# Patient Record
Sex: Male | Born: 1975 | Race: White | Hispanic: No | Marital: Married | Smoking: Former smoker
Health system: Southern US, Community
[De-identification: ages and names within clinical notes are randomized; demographics above are authoritative.]

## PROBLEM LIST (undated history)

## (undated) DIAGNOSIS — T7840XA Allergy, unspecified, initial encounter: Secondary | ICD-10-CM

## (undated) DIAGNOSIS — E669 Obesity, unspecified: Secondary | ICD-10-CM

## (undated) DIAGNOSIS — I10 Essential (primary) hypertension: Secondary | ICD-10-CM

## (undated) DIAGNOSIS — J302 Other seasonal allergic rhinitis: Secondary | ICD-10-CM

## (undated) DIAGNOSIS — G43909 Migraine, unspecified, not intractable, without status migrainosus: Secondary | ICD-10-CM

## (undated) DIAGNOSIS — R748 Abnormal levels of other serum enzymes: Secondary | ICD-10-CM

## (undated) DIAGNOSIS — E66811 Obesity, class 1: Secondary | ICD-10-CM

## (undated) HISTORY — DX: Abnormal levels of other serum enzymes: R74.8

## (undated) HISTORY — DX: Obesity, unspecified: E66.9

## (undated) HISTORY — DX: Essential (primary) hypertension: I10

## (undated) HISTORY — DX: Allergy, unspecified, initial encounter: T78.40XA

## (undated) HISTORY — DX: Other seasonal allergic rhinitis: J30.2

## (undated) HISTORY — DX: Obesity, class 1: E66.811

## (undated) HISTORY — DX: Migraine, unspecified, not intractable, without status migrainosus: G43.909

---

## 2014-10-18 ENCOUNTER — Other Ambulatory Visit: Payer: Self-pay | Admitting: Family Medicine

## 2014-10-18 DIAGNOSIS — J309 Allergic rhinitis, unspecified: Secondary | ICD-10-CM

## 2014-10-18 DIAGNOSIS — R0982 Postnasal drip: Principal | ICD-10-CM

## 2014-10-18 NOTE — Telephone Encounter (Signed)
Pt needs refill on Loratidine, Mometasone furoate. [facility] [place].

## 2014-10-19 MED ORDER — MOMETASONE FUROATE 50 MCG/ACT NA SUSP: 2.0000 | Freq: Every day | NASAL | Status: DC

## 2014-10-19 MED ORDER — LORATADINE 10 MG PO TABS: 10.0000 mg | ORAL_TABLET | Freq: Every day | ORAL | Status: DC

## 2014-10-19 NOTE — Telephone Encounter (Signed)
Refill request was sent to Dr. Edwena Felty for approval and submission.  Loratadine , 1 (one) Tablet daily, #30, 30 days starting 07/17/2014, Ref. x2. Active. Associated Diagnosis:Allergic rhinitis, seasonal (477.9  J30.2)  Recorded 07/17/2014 10:45 AM by Edwena Felty, MD, Office Visit. ePrescribed to [phone], 07/17/2014 10:44 AM [id number], [address] [place], [place], [place] [address] [phone]  Nasonex 50MCG/ACT, 2 (two) Suspension in each nostril daily, 1 Bottle, 30 days starting 07/17/2014, Ref. x2. Active. Associated Diagnosis: Allergic rhinitis, seasonal (477.9  J30.2) Recorded 07/17/2014 10:45 AM by Edwena Felty, MD, Office Visit.

## 2014-10-24 ENCOUNTER — Ambulatory Visit (INDEPENDENT_AMBULATORY_CARE_PROVIDER_SITE_OTHER): Payer: BLUE CROSS/BLUE SHIELD | Admitting: Family Medicine

## 2014-10-24 ENCOUNTER — Encounter: Payer: Self-pay | Admitting: Family Medicine

## 2014-10-24 VITALS — BP 118/78 | HR 68 | Temp 98.3°F | Resp 16 | Ht 70.0 in | Wt 217.2 lb

## 2014-10-24 DIAGNOSIS — Z0000 Encounter for general adult medical examination without abnormal findings: Secondary | ICD-10-CM | POA: Diagnosis not present

## 2014-10-24 DIAGNOSIS — Z13220 Encounter for screening for lipoid disorders: Secondary | ICD-10-CM | POA: Diagnosis not present

## 2014-10-24 DIAGNOSIS — E66811 Obesity, class 1: Secondary | ICD-10-CM | POA: Insufficient documentation

## 2014-10-24 DIAGNOSIS — Z23 Encounter for immunization: Secondary | ICD-10-CM | POA: Diagnosis not present

## 2014-10-24 DIAGNOSIS — E669 Obesity, unspecified: Secondary | ICD-10-CM | POA: Insufficient documentation

## 2014-10-24 DIAGNOSIS — R748 Abnormal levels of other serum enzymes: Secondary | ICD-10-CM | POA: Diagnosis not present

## 2014-10-24 DIAGNOSIS — Z720 Tobacco use: Secondary | ICD-10-CM

## 2014-10-24 DIAGNOSIS — G43009 Migraine without aura, not intractable, without status migrainosus: Secondary | ICD-10-CM | POA: Diagnosis not present

## 2014-10-24 DIAGNOSIS — F17210 Nicotine dependence, cigarettes, uncomplicated: Secondary | ICD-10-CM | POA: Insufficient documentation

## 2014-10-24 DIAGNOSIS — I10 Essential (primary) hypertension: Secondary | ICD-10-CM | POA: Insufficient documentation

## 2014-10-24 MED ORDER — SUMATRIPTAN SUCCINATE 50 MG PO TABS: ORAL_TABLET | ORAL | Status: DC

## 2014-10-24 MED ORDER — INFLUENZA VAC SPLIT QUAD 0.5 ML IM SUSP: 0.5000 mL | Freq: Once | INTRAMUSCULAR | Status: DC

## 2014-10-24 NOTE — Progress Notes (Signed)
Name: Robert Vang   MRN: [id number]    DOB: [birth year]   Date:10/24/2014       Progress Note  Subjective  Chief Complaint  Chief Complaint  Patient presents with  . Annual Exam  . Medication Refill    imitrex    HPI  Patient is here today for a Complete Male Physical Exam:  The patient has no acute concerns. Overall feels healthy. Diet is not well balanced he admits to eating high carb, high cholesterol foods. 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 Sees dentist regularly and addresses vision concerns with ophthalmologist if applicable. In regards to sexual activity the patient is currently sexually active. Currently is not concerned about exposure to any STDs.   Patient here for follow-up of elevated blood pressure. He is exercising and is adherent to low salt diet.  Using benazepril 6m one a day. Blood pressure is well controlled at home. Cardiac symptoms none. Patient denies chest pain, chest pressure/discomfort, dyspnea, fatigue, irregular heart beat, lower extremity edema, near-syncope, orthopnea and palpitations.  Cardiovascular risk factors: hypertension and male gender. Use of agents associated with hypertension: none. History of target organ damage: none.     [name] suffers from chronic migraine headaches. Generally, the headaches last about 1-2 days and occur once a month. The headaches usually occur upon waking up but one time a few months ago an episode started after lunch time at work and lasted several days.  Since then this has not happened again.  The headaches are usually sharp and squeezing and are frontal in location. The patient rates his most severe headaches a 8 on a scale from 1 to 10. Recently, the headaches are decreasing in frequency. Work attendance or other daily activities are not affected by the headaches. Precipitating factors include none which have been determined. The headaches are  usually not preceded by an aura consisting of blurry vision. Associated neurologic symptoms which are present include: none. The patient denies nausea, vomiting, visual changes, dizziness, loss of balance, muscle weakness, numbness of extremities and speech difficulties during or after the headaches. Other associated symptoms include: nothing pertinent. Home treatment has included Imitrex 50 mg which works well.   Past Medical History  Diagnosis Date  . Obesity, Class I, BMI 30-34.9   . HTN, goal below 140/90   . Allergic rhinitis, seasonal   . Allergy   . Elevated liver enzymes   . Migraine headache     History reviewed. No pertinent past surgical history.  Family History  Problem Relation Age of Onset  . Cancer Maternal Uncle     colon  . Diabetes Maternal Grandfather     Social History   Social History  . Marital Status: Married    Spouse Name: N/A  . Number of Children: N/A  . Years of Education: N/A   Occupational History  . Not on file.   Social History Main Topics  . Smoking status: Current Some Day Smoker  . Smokeless tobacco: Current User    Types: Chew  . Alcohol Use: 0.0 oz/week    0 Standard drinks or equivalent per week     Comment: ocassionally  . Drug Use: No  . Sexual Activity:    Partners: Female    BMuseum/gallery curator None   Other Topics Concern  . Not on file   Social History Narrative  . No narrative on file  Current outpatient prescriptions:  .  benazepril (LOTENSIN) 5 MG tablet, , Disp: , Rfl:  .  loratadine (CLARITIN) 10 MG tablet, Take 1 tablet (10 mg total) by mouth daily., Disp: 30 tablet, Rfl: 5 .  mometasone (NASONEX) 50 MCG/ACT nasal spray, Place 2 sprays into the nose daily., Disp: 17 g, Rfl: 5 .  SUMAtriptan (IMITREX) 25 MG tablet, , Disp: , Rfl:   No Known Allergies  ROS  CONSTITUTIONAL: No significant weight changes, fever, chills, weakness or fatigue.  HEENT:  - Eyes: No visual changes.  - Ears: No auditory  changes. No pain.  - Nose: No sneezing, congestion, runny nose. - Throat: No sore throat. No changes in swallowing. SKIN: No rash or itching.  CARDIOVASCULAR: No chest pain, chest pressure or chest discomfort. No palpitations or edema.  RESPIRATORY: No shortness of breath, cough or sputum.  GASTROINTESTINAL: No anorexia, nausea, vomiting. No changes in bowel habits. No abdominal pain or blood.  GENITOURINARY: No dysuria. No frequency. No discharge.  NEUROLOGICAL: No headache, dizziness, syncope, paralysis, ataxia, numbness or tingling in the extremities. No memory changes. No change in bowel or bladder control.  MUSCULOSKELETAL: No joint pain. No muscle pain. HEMATOLOGIC: No anemia, bleeding or bruising.  LYMPHATICS: No enlarged lymph nodes.  PSYCHIATRIC: No change in mood. No change in sleep pattern.  ENDOCRINOLOGIC: No reports of sweating, cold or heat intolerance. No polyuria or polydipsia.   Objective  Filed Vitals:   10/24/14 0901  BP: 118/78  Pulse: 68  Temp: 98.3 F (36.8 C)  TempSrc: Oral  Resp: 16  Height: _0  (1.778 m)  Weight: 217 lb 3.2 oz (98.521 kg)  SpO2: 97%   Body mass index is 31.16 kg/(m^2).  Depression screen PHQ 2/9 10/24/2014  Decreased Interest 0  Down, Depressed, Hopeless 0  PHQ - 2 Score 0     Physical Exam  Constitutional: Patient appears well-developed and well-nourished. In no distress.  HEENT:  - Head: Normocephalic and atraumatic.  - Ears: Bilateral TMs gray, no erythema or effusion - Nose: Nasal mucosa moist - Mouth/Throat: Oropharynx is clear and moist. No tonsillar hypertrophy or erythema. No post nasal drainage.  - Eyes: Conjunctivae clear, EOM movements normal. PERRLA. No scleral icterus.  Neck: Normal range of motion. Neck supple. No JVD present. No thyromegaly present.  Cardiovascular: Normal rate, regular rhythm and normal heart sounds.  No murmur heard.  Pulmonary/Chest: Effort normal and breath sounds normal. No respiratory  distress. Abdominal: Soft. Bowel sounds are normal, no distension. There is no tenderness. no masses BREAST: Bilateral breast exam normal with no masses, skin changes or nipple discharge MALE GENITALIA: Bilateral testes descended with no masses, no penile lesions, no penile discharge. PROSTATE: Normal prostate size and consistency. RECTAL: no rectal masses or hemorrhoids Musculoskeletal: Normal range of motion bilateral UE and LE, no joint effusions. Peripheral vascular: Bilateral LE no edema. Neurological: CN II-XII grossly intact with no focal deficits. Alert and oriented to person, place, and time. Coordination, balance, strength, speech and gait are normal.  Skin: Skin is warm and dry. No rash noted. No erythema.  Psychiatric: Patient has a normal mood and affect. Behavior is normal in office today. Judgment and thought content normal in office today.    Assessment & Plan  1. Annual physical exam Healthy male with stable chronic medical issues.   2. Hypertension goal BP (blood pressure) < 140/90 JNC-8 vs ADA treatment goals discussed with patient. Current blood pressure well controled. Continue current regimen of anti-hypertensive medications.  On review of most current eGFR on blood work there is not concern for Chronic Kidney Disease at this time.    - CBC with Differential/Platelet - Comprehensive metabolic panel - Lipid panel  3. Migraine without aura and without status migrainosus, not intractable Clinically stable findings based on clinical exam and on review of any pertinent results. Recommended to patient that they continue their current regimen with regular follow ups.  - SUMAtriptan (IMITREX) 50 MG tablet; Take 1-2 tablet by mouth at onset of headache, may repeat x1 2hrs later, max 265m/day  Dispense: 20 tablet; Refill: 5  4. Obesity, Class I, BMI 30-34.9 The patient has been counseled on their higher than normal BMI.  They have verbally expressed understanding their  increased risk for other diseases.  In efforts to meet a better target BMI goal the patient has been counseled on lifestyle, diet and exercise modification tactics. Start with moderate intensity aerobic exercise (walking, jogging, elliptical, swimming, group or individual sports, hiking) at least 330ms a day at least 4 days a week and increase intensity, duration, frequency as tolerated. Diet should include well balance fresh fruits and vegetables avoiding processed foods, carbohydrates and sugars. Drink at least 8oz 10 glasses a day avoiding sodas, sugary fruit drinks, sweetened tea. Check weight on a reliable scale daily and monitor weight loss progress daily. Consider investing in mobile phone apps that will help keep track of weight loss goals.   5. Cigarette smoker The patient has been counseled on smoking cessation benefits, goals, strategies and available over the counter and prescription medications that may help them in their efforts.  Options discussed include Nicoderm patches, Wellbutrin and Chantix.  The patient voices understanding their increased risk of cardiovascular and pulmonary diseases with continued use of tobacco products.   6. Elevated liver enzymes Due to repeat lab work. Likely fatty liver.  - Comprehensive metabolic panel  7. Screening cholesterol level Screening for cholesterol  - Lipid panel  8. Need for immunization against influenza Flu shot given today.  - influenza vac split quadrivalent PF (FLUARIX) injection 0.5 mL; Inject 0.5 mLs into the muscle once.

## 2014-10-25 LAB — CBC WITH DIFFERENTIAL/PLATELET
Basophils Absolute: 0.1 10*3/uL (ref 0.0–0.2)
Basos: 1 %
EOS (ABSOLUTE): 0.2 10*3/uL (ref 0.0–0.4)
Eos: 2 %
HEMOGLOBIN: 15.3 g/dL (ref 12.6–17.7)
Hematocrit: 44.4 % (ref 37.5–51.0)
Immature Grans (Abs): 0 10*3/uL (ref 0.0–0.1)
Immature Granulocytes: 0 %
LYMPHS ABS: 2.1 10*3/uL (ref 0.7–3.1)
Lymphs: 33 %
MCH: 30.2 pg (ref 26.6–33.0)
MCHC: 34.5 g/dL (ref 31.5–35.7)
MCV: 88 fL (ref 79–97)
MONOCYTES: 9 %
MONOS ABS: 0.6 10*3/uL (ref 0.1–0.9)
NEUTROS ABS: 3.6 10*3/uL (ref 1.4–7.0)
Neutrophils: 55 %
Platelets: 262 10*3/uL (ref 150–379)
RBC: 5.06 x10E6/uL (ref 4.14–5.80)
RDW: 13.5 % (ref 12.3–15.4)
WBC: 6.6 10*3/uL (ref 3.4–10.8)

## 2014-10-25 LAB — LIPID PANEL
CHOLESTEROL TOTAL: 135 mg/dL (ref 100–199)
Chol/HDL Ratio: 3 ratio units (ref 0.0–5.0)
HDL: 45 mg/dL (ref >39)
LDL Calculated: 69 mg/dL (ref 0–99)
TRIGLYCERIDES: 106 mg/dL (ref 0–149)
VLDL Cholesterol Cal: 21 mg/dL (ref 5–40)

## 2014-10-25 LAB — COMPREHENSIVE METABOLIC PANEL
ALBUMIN: 4.4 g/dL (ref 3.5–5.5)
ALK PHOS: 75 IU/L (ref 39–117)
ALT: 35 IU/L (ref 0–44)
AST: 24 IU/L (ref 0–40)
Albumin/Globulin Ratio: 1.9 (ref 1.1–2.5)
BILIRUBIN TOTAL: 0.5 mg/dL (ref 0.0–1.2)
BUN / CREAT RATIO: 14 (ref 8–19)
BUN: 12 mg/dL (ref 6–20)
CHLORIDE: 103 mmol/L (ref 97–108)
CO2: 25 mmol/L (ref 18–29)
Calcium: 9.6 mg/dL (ref 8.7–10.2)
Creatinine, Ser: 0.86 mg/dL (ref 0.76–1.27)
GFR calc Af Amer: 127 mL/min/{1.73_m2} (ref >59)
GFR calc non Af Amer: 110 mL/min/{1.73_m2} (ref >59)
GLOBULIN, TOTAL: 2.3 g/dL (ref 1.5–4.5)
Glucose: 104 mg/dL — ABNORMAL HIGH (ref 65–99)
Potassium: 4.6 mmol/L (ref 3.5–5.2)
SODIUM: 143 mmol/L (ref 134–144)
Total Protein: 6.7 g/dL (ref 6.0–8.5)

## 2014-11-02 ENCOUNTER — Telehealth: Payer: Self-pay | Admitting: Family Medicine

## 2014-11-02 DIAGNOSIS — G43009 Migraine without aura, not intractable, without status migrainosus: Secondary | ICD-10-CM

## 2014-11-02 MED ORDER — SUMATRIPTAN SUCCINATE 50 MG PO TABS: ORAL_TABLET | ORAL | Status: DC

## 2014-11-02 NOTE — Telephone Encounter (Signed)
Tried to contact this patient to relay the information below, but he was not in. I spoke with his wife and she stated that she will contact the insurance company to find out what's going on and I said ok.

## 2014-11-02 NOTE — Telephone Encounter (Signed)
Please let Mr. Filomeno Cromley know: I got a letter from his insurance stating they will only cover #18 of his Sumatriptan 50 mg medication for his headaches. New Rx sent to his pharmacy. If he has trouble picking it up let us know.

## 2014-11-03 ENCOUNTER — Telehealth: Payer: Self-pay | Admitting: Family Medicine

## 2014-11-03 NOTE — Telephone Encounter (Signed)
Dr. Sherley Bounds returned this patient's call and spoke with the wife.   Wife said that she called the insurance company and they stated they will dispense 9 PDA, but 9 is good for this patient at the moment.

## 2014-11-03 NOTE — Telephone Encounter (Signed)
Pt states that our office called her yesterday about a RX and is wanting a call back.

## 2015-03-07 ENCOUNTER — Ambulatory Visit (INDEPENDENT_AMBULATORY_CARE_PROVIDER_SITE_OTHER): Payer: BLUE CROSS/BLUE SHIELD | Admitting: Family Medicine

## 2015-03-07 ENCOUNTER — Encounter: Payer: Self-pay | Admitting: Family Medicine

## 2015-03-07 ENCOUNTER — Ambulatory Visit
Admission: RE | Admit: 2015-03-07 | Discharge: 2015-03-07 | Disposition: A | Discharge status: Home / Self Care | Payer: BLUE CROSS/BLUE SHIELD | Source: Ambulatory Visit | Attending: Family Medicine | Admitting: Family Medicine

## 2015-03-07 VITALS — BP 140/82 | HR 66 | Temp 97.4°F | Resp 12 | Ht 70.0 in | Wt 227.0 lb

## 2015-03-07 DIAGNOSIS — R079 Chest pain, unspecified: Secondary | ICD-10-CM | POA: Insufficient documentation

## 2015-03-07 NOTE — Progress Notes (Signed)
Name: Robert Vang   MRN: [id number]    DOB: May 10, 1975   Date:03/07/2015       Progress Note  Subjective  Chief Complaint  Chief Complaint  Patient presents with  . Chest Pain    patient stated that he has been having some right side chest discomfort. the actual location is under his nipple that radiates to the side. patient stated that he has had some sharp shooting pains under his pectoris.    HPI  Robert Vang is a 39 year old male with right sided chest wall pain. Onset 1 month ago. Located just under right nipple, pectoralis muscle. Sometimes radiates across anterior chest wall to sternum. Can occur suddenly, lasting less than a second, sharp pain, resolves on it's own, not occuring at night. Not associated with left sided chest pain, substernal pain, SOB, dizziness, fatigue, fevers, chills, cough. Has not tried anything for these symptoms as he was not sure what was causing it. 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 Did have severe GERD 1 month ago at onset, resolved with TUMS use at the time. Not having epigastric pain, brash or GERD symptoms recently.  Past Medical History  Diagnosis Date  . Obesity, Class I, BMI 30-34.9   . HTN, goal below 140/90   . Allergic rhinitis, seasonal   . Allergy   . Elevated liver enzymes   . Migraine headache     Patient Active Problem List   Diagnosis Date Noted  . Hypertension goal BP (blood pressure) < 140/90 10/24/2014  . Migraine without aura and without status migrainosus, not intractable 10/24/2014  . Obesity, Class I, BMI 30-34.9 10/24/2014  . Cigarette smoker 10/24/2014  . Elevated liver enzymes 10/24/2014  . Screening cholesterol level 10/24/2014  . Need for immunization against influenza 10/24/2014  . Annual physical exam 10/24/2014    Social History  Substance Use Topics  . Smoking status: Current Some Day Smoker  . Smokeless tobacco: Current User    Types: Chew  . Alcohol Use: 0.0  oz/week    0 Standard drinks or equivalent per week     Comment: ocassionally     Current outpatient prescriptions:  .  benazepril (LOTENSIN) 5 MG tablet, , Disp: , Rfl:  .  loratadine (CLARITIN) 10 MG tablet, Take 1 tablet (10 mg total) by mouth daily., Disp: 30 tablet, Rfl: 5 .  mometasone (NASONEX) 50 MCG/ACT nasal spray, Place 2 sprays into the nose daily., Disp: 17 g, Rfl: 5 .  SUMAtriptan (IMITREX) 50 MG tablet, Take 1-2 tablet by mouth at onset of headache, may repeat x1 2hrs later, max /day, Disp: 18 tablet, Rfl: 5  History reviewed. No pertinent past surgical history.  Family History  Problem Relation Age of Onset  . Cancer Maternal Uncle     colon  . Diabetes Maternal Grandfather     No Known Allergies   Review of Systems  CONSTITUTIONAL: No significant weight changes, fever, chills, weakness or fatigue.  CARDIOVASCULAR: Yes chest pain. No chest pressure or chest discomfort. No palpitations or edema.  RESPIRATORY: No shortness of breath, cough or sputum.  GASTROINTESTINAL: No anorexia, nausea, vomiting. No changes in bowel habits. No abdominal pain or blood.  NEUROLOGICAL: No headache, dizziness, syncope, paralysis, ataxia, numbness or tingling in the extremities. No memory changes. No change in bowel or bladder control.  PSYCHIATRIC: No change in mood. No change in sleep pattern.    Objective  BP 140/82 mmHg  Pulse 66  Temp(Src) 97.4 F (36.3 C) (Oral)  Resp 12  Ht 5\' 10"  (1.778 m)  Wt 227 lb (102.967 kg)  BMI 32.57 kg/m2  SpO2 97% Body mass index is 32.57 kg/(m^2).  Physical Exam  Constitutional: Patient appears well-developed and well-nourished. In no distress.  Neck: Normal range of motion. Neck supple. No JVD present. No thyromegaly present.  Cardiovascular: Normal rate, regular rhythm and normal heart sounds.  No murmur heard.  Pulmonary/Chest: No reproducible chest wall pain. Effort normal and breath sounds normal. No respiratory  distress. Musculoskeletal: Normal range of motion bilateral UE and LE, no joint effusions. Psychiatric: Patient has a normal mood and affect. Behavior is normal in office today. Judgment and thought content normal in office today.   Assessment & Plan  1. Right-sided chest pain EKG NSR with no ST segment changes. Less likely to be cardiac in nature however BP is borderline today. FLP 10/2014 well controled. Risk factors minimal. Will get Troponin to be complete and CXR. Start PT, likely musculoskeletal.   - EKG 12-Lead - Troponin I - DG Chest 2 View; Future - Ambulatory referral to Physical Therapy

## 2015-03-08 LAB — TROPONIN I

## 2015-03-13 ENCOUNTER — Telehealth: Payer: Self-pay

## 2015-03-13 ENCOUNTER — Telehealth: Payer: Self-pay | Admitting: Family Medicine

## 2015-03-13 NOTE — Telephone Encounter (Signed)
Pts wife called and has questions about a RX please return her call.

## 2015-03-13 NOTE — Telephone Encounter (Signed)
Wants to get rx for nicotrol inhaler to help quit dipping? Didn't know if you could do since saw him last week but did not discuss?

## 2015-03-14 ENCOUNTER — Other Ambulatory Visit: Payer: Self-pay | Admitting: Family Medicine

## 2015-03-14 DIAGNOSIS — F17220 Nicotine dependence, chewing tobacco, uncomplicated: Secondary | ICD-10-CM

## 2015-03-14 MED ORDER — NICOTINE 10 MG IN INHA: RESPIRATORY_TRACT | Status: DC

## 2015-03-14 NOTE — Telephone Encounter (Signed)
[facility] If insurance does not cover medications ask patient to find out if they will cover nicotine patches.

## 2015-03-14 NOTE — Telephone Encounter (Signed)
Wife Constance Haw(Lorie Lengyel) would like return call from the provider to discuss the medication. [phone]

## 2015-05-02 ENCOUNTER — Other Ambulatory Visit: Payer: Self-pay | Admitting: Family Medicine

## 2015-07-03 ENCOUNTER — Other Ambulatory Visit: Payer: Self-pay

## 2015-07-03 DIAGNOSIS — J309 Allergic rhinitis, unspecified: Secondary | ICD-10-CM

## 2015-07-03 DIAGNOSIS — R0982 Postnasal drip: Principal | ICD-10-CM

## 2015-07-03 MED ORDER — MOMETASONE FUROATE 50 MCG/ACT NA SUSP: 2.0000 | Freq: Every day | NASAL | Status: DC

## 2015-07-12 ENCOUNTER — Encounter: Payer: Self-pay | Admitting: Family Medicine

## 2015-07-12 ENCOUNTER — Ambulatory Visit (INDEPENDENT_AMBULATORY_CARE_PROVIDER_SITE_OTHER): Payer: BLUE CROSS/BLUE SHIELD | Admitting: Family Medicine

## 2015-07-12 VITALS — BP 117/72 | HR 80 | Temp 98.5°F | Resp 14 | Ht 70.0 in | Wt 218.0 lb

## 2015-07-12 DIAGNOSIS — E669 Obesity, unspecified: Secondary | ICD-10-CM | POA: Diagnosis not present

## 2015-07-12 DIAGNOSIS — F17210 Nicotine dependence, cigarettes, uncomplicated: Secondary | ICD-10-CM

## 2015-07-12 DIAGNOSIS — Z720 Tobacco use: Secondary | ICD-10-CM

## 2015-07-12 DIAGNOSIS — J069 Acute upper respiratory infection, unspecified: Secondary | ICD-10-CM

## 2015-07-12 DIAGNOSIS — I10 Essential (primary) hypertension: Secondary | ICD-10-CM | POA: Diagnosis not present

## 2015-07-12 MED ORDER — BENAZEPRIL HCL 5 MG PO TABS: 5.0000 mg | ORAL_TABLET | Freq: Every day | ORAL | Status: DC

## 2015-07-12 NOTE — Patient Instructions (Addendum)
Try to use PLAIN allergy medicine without the decongestant Avoid: phenylephrine, phenylpropanolamine, and pseudoephredine  Try to lose down to 200 pounds to start with, and a goal of 190 pounds ultimately  Your goal blood pressure is less than 140 mmHg on top. Try to follow the DASH guidelines (DASH stands for Dietary Approaches to Stop Hypertension) Try to limit the sodium in your diet.  Ideally, consume less than 1.5 grams (less than 1,500mg ) per day. Do not add salt when cooking or at the table.  Check the sodium amount on labels when shopping, and choose items lower in sodium when given a choice. Avoid or limit foods that already contain a lot of sodium. Eat a diet rich in fruits and vegetables and whole grains.  If your blood pressure drops below 110 on top, then you can stop your blood pressure pill  DASH Eating Plan DASH stands for "Dietary Approaches to Stop Hypertension." The DASH eating plan is a healthy eating plan that has been shown to reduce high blood pressure (hypertension). Additional health benefits may include reducing the risk of type 2 diabetes mellitus, heart disease, and stroke. The DASH eating plan may also help with weight loss. WHAT DO I NEED TO KNOW ABOUT THE DASH EATING PLAN? For the DASH eating plan, you will follow these general guidelines:  Choose foods with a percent daily value for sodium of less than 5% (as listed on the food label).  Use salt-free seasonings or herbs instead of table salt or sea salt.  Check with your health care provider or pharmacist before using salt substitutes.  Eat lower-sodium products, often labeled as "lower sodium" or "no salt added."  Eat fresh foods.  Eat more vegetables, fruits, and low-fat dairy products.  Choose whole grains. Look for the word "whole" as the first word in the ingredient list.  Choose fish and skinless chicken or [place] more often than red meat. Limit fish, poultry, and meat to 6 oz (170 g) each  day.  Limit sweets, desserts, sugars, and sugary drinks.  Choose heart-healthy fats.  Limit cheese to 1 oz (28 g) per day.  Eat more home-cooked food and less restaurant, buffet, and fast food.  Limit fried foods.  Cook foods using methods other than frying.  Limit canned vegetables. If you do use them, rinse them well to decrease the sodium.  When eating at a restaurant, ask that your food be prepared with less salt, or no salt if possible. WHAT FOODS CAN I EAT? Seek help from a dietitian for individual calorie needs. Grains Whole grain or whole wheat bread. Brown rice. Whole grain or whole wheat pasta. Quinoa, bulgur, and whole grain cereals. Low-sodium cereals. Corn or whole wheat flour tortillas. Whole grain cornbread. Whole grain crackers. Low-sodium crackers. Vegetables Fresh or frozen vegetables (raw, steamed, roasted, or grilled). Low-sodium or reduced-sodium tomato and vegetable juices. Low-sodium or reduced-sodium tomato sauce and paste. Low-sodium or reduced-sodium canned vegetables.  Fruits All fresh, canned (in natural juice), or frozen fruits. Meat and Other Protein Products Ground beef (85% or leaner), grass-fed beef, or beef trimmed of fat. Skinless chicken or [place]. Ground chicken or [place]. Pork trimmed of fat. All fish and seafood. Eggs. Dried beans, peas, or lentils. Unsalted nuts and seeds. Unsalted canned beans. Dairy Low-fat dairy products, such as skim or 1% milk, 2% or reduced-fat cheeses, low-fat ricotta or cottage cheese, or plain low-fat yogurt. Low-sodium or reduced-sodium cheeses. Fats and Oils Tub margarines without trans fats. Light or reduced-fat mayonnaise  and salad dressings (reduced sodium). Avocado. Safflower, olive, or canola oils. Natural peanut or almond butter. Other Unsalted popcorn and pretzels. The items listed above may not be a complete list of recommended foods or beverages. Contact your dietitian for more options. WHAT FOODS ARE NOT  RECOMMENDED? Grains White bread. White pasta. White rice. Refined cornbread. Bagels and croissants. Crackers that contain trans fat. Vegetables Creamed or fried vegetables. Vegetables in a cheese sauce. Regular canned vegetables. Regular canned tomato sauce and paste. Regular tomato and vegetable juices. Fruits Dried fruits. Canned fruit in light or heavy syrup. Fruit juice. Meat and Other Protein Products Fatty cuts of meat. Ribs, chicken wings, bacon, sausage, bologna, salami, chitterlings, fatback, hot dogs, bratwurst, and packaged luncheon meats. Salted nuts and seeds. Canned beans with salt. Dairy Whole or 2% milk, cream, half-and-half, and cream cheese. Whole-fat or sweetened yogurt. Full-fat cheeses or blue cheese. Nondairy creamers and whipped toppings. Processed cheese, cheese spreads, or cheese curds. Condiments Onion and garlic salt, seasoned salt, table salt, and sea salt. Canned and packaged gravies. Worcestershire sauce. Tartar sauce. Barbecue sauce. Teriyaki sauce. Soy sauce, including reduced sodium. Steak sauce. Fish sauce. Oyster sauce. Cocktail sauce. Horseradish. Ketchup and mustard. Meat flavorings and tenderizers. Bouillon cubes. Hot sauce. Tabasco sauce. Marinades. Taco seasonings. Relishes. Fats and Oils Butter, stick margarine, lard, shortening, ghee, and bacon fat. Coconut, palm kernel, or palm oils. Regular salad dressings. Other Pickles and olives. Salted popcorn and pretzels. The items listed above may not be a complete list of foods and beverages to avoid. Contact your dietitian for more information. WHERE CAN I FIND MORE INFORMATION? National Heart, Lung, and Blood Institute: CableProm[employer].itwww.nhlbi.nih.gov/health/health-topics/topics/dash/   This information is not intended to replace advice given to you by your health care provider. Make sure you discuss any questions you have with your health care provider.   Document Released: 02/13/2011 Document Revised: 03/17/2014  Document Reviewed: 12/29/2012 Elsevier Interactive Patient Education [employer]2016 Elsevier Inc.

## 2015-07-12 NOTE — Assessment & Plan Note (Signed)
Encouraged weight loss 

## 2015-07-12 NOTE — Assessment & Plan Note (Signed)
Discussed DASH guidelines; continue ACE-I; if he can lose weight, he might be able to stop the blood pressure emdicine; avoid decongestants

## 2015-07-12 NOTE — Assessment & Plan Note (Signed)
Encouraged him to quit, warned that it would negate the beneficial lipid panel he has

## 2015-07-12 NOTE — Progress Notes (Signed)
BP 117/72 mmHg  Pulse 80  Temp(Src) 98.5 F (36.9 C) (Oral)  Resp 14  Ht  (1.778 m)  Wt 218 lb (98.884 kg)  BMI 31.28 kg/m2  SpO2 97%   Subjective:    Patient ID: Robert Vang, male    DOB: 08-Sep-1975, 40 y.o.   MRN: [id number]  HPI: Robert Vang is a 40 y.o. male  Chief Complaint  Patient presents with  . Medication Refill  . URI    onset 5 days symptoms include:sore throat, felt bad, congestion,cough, improving   Started with sore throat on Sunday; very congested in the head; ears are full, head is full; phlegm in throat, not dark yellow; occasional cough; used Nyquil, dayquil for 3 days; a fever a few days ago; was in [place]; does not have body aches  Started BP medicine when he weighed about 230 pounds; started the med, and then BP went down when he lost weight, so stopped; 218 steady for now; he'd like to lose a little weight; he weighed 227 pounds in December  Cholesterol panel is fabulous, reviewed with him and father has similar good cholesterol   Depression screen [facility] 2/9 07/12/2015 03/07/2015 10/24/2014  Decreased Interest 0 0 0  Down, Depressed, Hopeless 0 0 0  PHQ - 2 Score 0 0 0   Relevant past medical, surgical, family and social history reviewed Past Medical History  Diagnosis Date  . Obesity, Class I, BMI 30-34.9   . HTN, goal below 140/90   . Allergic rhinitis, seasonal   . Allergy   . Elevated liver enzymes   . Migraine headache    History reviewed. No pertinent past surgical history.   Family History  Problem Relation Age of Onset  . Cancer Maternal Uncle     colon  . Diabetes Maternal Grandfather   . Hypertension Mother   . Diabetes Mother   . Hypertension Father   no stroke or heart attack in immediate family PGF smoked 4 packs a day, heart attack  Social History  Substance Use Topics  . Smoking status: Current Some Day Smoker -- 0.10 packs/day for 10 years    Types: Cigarettes  . Smokeless tobacco: Current User    Types: Chew   . Alcohol Use: 3.6 oz/week    0 Standard drinks or equivalent, 6 Cans of beer per week     Comment: ocassionally  quit smoking when wife got pregnant; started dipping; recently got nicotrol inhaler which helped him quit dipping; smokes just when drinking, but doing the dip/smoke; trying to not drink  Interim medical history since last visit reviewed. Allergies and medications reviewed  Review of Systems Per HPI unless specifically indicated above     Objective:    BP 117/72 mmHg  Pulse 80  Temp(Src) 98.5 F (36.9 C) (Oral)  Resp 14  Ht  (1.778 m)  Wt 218 lb (98.884 kg)  BMI 31.28 kg/m2  SpO2 97%  Wt Readings from Last 3 Encounters:  07/12/15 218 lb (98.884 kg)  03/07/15 227 lb (102.967 kg)  10/24/14 217 lb 3.2 oz (98.521 kg)    Physical Exam  Constitutional: He appears well-developed and well-nourished. No distress.  HENT:  Head: Normocephalic and atraumatic.  Eyes: EOM are normal. No scleral icterus.  Neck: No thyromegaly present.  Cardiovascular: Normal rate and regular rhythm.   Pulmonary/Chest: Effort normal and breath sounds normal.  Abdominal: Soft. He exhibits no distension.  Musculoskeletal: He exhibits no edema.  Lymphadenopathy:  He has no cervical adenopathy.  Neurological: Coordination normal.  Skin: Skin is warm and dry. No pallor.  Psychiatric: He has a normal mood and affect. His behavior is normal. Judgment and thought content normal.      Assessment & Plan:   Problem List Items Addressed This Visit      Cardiovascular and Mediastinum   Hypertension goal BP (blood pressure) < 140/90 - Primary    Discussed DASH guidelines; continue ACE-I; if he can lose weight, he might be able to stop the blood pressure emdicine; avoid decongestants      Relevant Medications   benazepril (LOTENSIN) 5 MG tablet     Other   Obesity, Class I, BMI 30-34.9    Encouraged weight loss      Cigarette smoker    Encouraged him to quit, warned that it would  negate the beneficial lipid panel he has       Other Visit Diagnoses    Upper respiratory infection        viral; should resolve on its own; avoid decongestants        Follow up plan: Return in about 3 months (around 10/25/2015) for complete physical.  An after-visit summary was printed and given to the patient at check-out.  Please see the patient instructions which may contain other information and recommendations beyond what is mentioned above in the assessment and plan.  Meds ordered this encounter  Medications  . benazepril (LOTENSIN) 5 MG tablet    Sig: Take 1 tablet (5 mg total) by mouth daily.    Dispense:  30 tablet    Refill:  2    No orders of the defined types were placed in this encounter.

## 2015-09-21 ENCOUNTER — Ambulatory Visit: Payer: BLUE CROSS/BLUE SHIELD | Admitting: Family Medicine

## 2015-10-29 ENCOUNTER — Encounter: Payer: BLUE CROSS/BLUE SHIELD | Admitting: Family Medicine

## 2015-10-31 ENCOUNTER — Telehealth: Payer: Self-pay | Admitting: Family Medicine

## 2015-10-31 DIAGNOSIS — G43009 Migraine without aura, not intractable, without status migrainosus: Secondary | ICD-10-CM

## 2015-10-31 MED ORDER — BENAZEPRIL HCL 5 MG PO TABS: 5.0000 mg | ORAL_TABLET | Freq: Every day | ORAL | 2 refills | Status: DC

## 2015-10-31 MED ORDER — SUMATRIPTAN SUCCINATE 50 MG PO TABS: ORAL_TABLET | ORAL | 5 refills | Status: DC

## 2015-10-31 NOTE — Telephone Encounter (Signed)
rx sent

## 2015-11-02 ENCOUNTER — Ambulatory Visit: Payer: BLUE CROSS/BLUE SHIELD | Admitting: Family Medicine

## 2015-11-30 ENCOUNTER — Ambulatory Visit: Payer: BLUE CROSS/BLUE SHIELD | Admitting: Family Medicine

## 2015-12-04 ENCOUNTER — Telehealth: Payer: Self-pay | Admitting: Family Medicine

## 2015-12-04 NOTE — Telephone Encounter (Signed)
Pt is coming in on 12/14/2015 for CPE, plans to discuss med refills during appt. Pt asked for refill on BP benazepril to be called into [facility] [place]. Pt aware that if he discusses med refill during CPE that he may receive a bill.

## 2015-12-04 NOTE — Telephone Encounter (Signed)
He should enough refills until Thanksgiving, and I'm hoping we'll be able to stop it altogether If he still needs it when current refills run out in late November, just call office

## 2015-12-04 NOTE — Telephone Encounter (Signed)
Called pt and informed him about remaining refills. He understands and will call it in.

## 2015-12-14 ENCOUNTER — Ambulatory Visit: Payer: BLUE CROSS/BLUE SHIELD | Admitting: Family Medicine

## 2016-01-04 ENCOUNTER — Encounter: Payer: Self-pay | Admitting: Family Medicine

## 2016-01-04 ENCOUNTER — Ambulatory Visit (INDEPENDENT_AMBULATORY_CARE_PROVIDER_SITE_OTHER): Payer: BLUE CROSS/BLUE SHIELD | Admitting: Family Medicine

## 2016-01-04 DIAGNOSIS — Z0000 Encounter for general adult medical examination without abnormal findings: Secondary | ICD-10-CM

## 2016-01-04 DIAGNOSIS — E669 Obesity, unspecified: Secondary | ICD-10-CM | POA: Diagnosis not present

## 2016-01-04 NOTE — Progress Notes (Signed)
Patient ID: Robert Vang, male   DOB: [birth year], 40 y.o.   MRN: [id number]   Subjective:   Robert Vang is a 40 y.o. male here for a complete physical exam  Interim issues since last visit: went to the ER twice and then urgent care twice; pulled his left trap; in so much pain; usually pain doesn't bother him and it was severe, "I couldn't take it no more"; went to [facility], they did xrays; then went back and was on different medicine; then to urgent care; better now  USPSTF grade A and B recommendations Alcohol: social Depression:  Depression screen [facility] 2/9 01/04/2016 07/12/2015 03/07/2015 10/24/2014  Decreased Interest 0 0 0 0  Down, Depressed, Hopeless 0 0 0 0  PHQ - 2 Score 0 0 0 0   Hypertension: well-controlled Obesity: will work on it, it crept up, two vacations and has gained about 10 pounds Tobacco use: former HIV, hep B, hep C: UTD STD testing and prevention (chl/gon/syphilis): UTD Lipids: patient wants to wait until Jan Glucose: wait until Jan Colorectal cancer: 2nd degree fam hx, start at 29 Breast cancer: no lumps Lung cancer: n/a Osteoporosis: n/a AAA: n/a Aspirin: n/a Diet: typical American, red meat Exercise: really active, walks 7 miles a day Skin cancer: no worrisome moles  Past Medical History:  Diagnosis Date  . Allergic rhinitis, seasonal   . Allergy   . Elevated liver enzymes   . HTN, goal below 140/90   . Migraine headache   . Obesity, Class I, BMI 30-34.9    History reviewed. No pertinent surgical history.   Family History  Problem Relation Age of Onset  . Hypertension Mother   . Diabetes Mother   . Hypertension Father   . Cancer Maternal Uncle     colon  . Diabetes Maternal Grandfather    Social History  Substance Use Topics  . Smoking status: Former Smoker    Packs/day: 0.10    Years: 10.00    Types: Cigarettes  . Smokeless tobacco: Current User    Types: Chew  . Alcohol use 3.6 oz/week    6 Cans of beer per week     Comment:  ocassionally   Review of Systems  Objective:   Vitals:   01/04/16 0826  BP: 116/74  Pulse: (!) 58  Resp: 16  Temp: 97.9 F (36.6 C)  TempSrc: Oral  SpO2: 98%  Weight: 226 lb (102.5 kg)   Body mass index is 32.43 kg/m. Wt Readings from Last 3 Encounters:  01/04/16 226 lb (102.5 kg)  07/12/15 218 lb (98.9 kg)  03/07/15 227 lb (103 kg)   Physical Exam  Constitutional: He appears well-developed and well-nourished. No distress.  HENT:  Head: Normocephalic and atraumatic.  Nose: Nose normal.  Mouth/Throat: Oropharynx is clear and moist.  Eyes: EOM are normal. No scleral icterus.  Neck: No JVD present. No thyromegaly present.  Cardiovascular: Normal rate, regular rhythm and normal heart sounds.   Pulmonary/Chest: Effort normal and breath sounds normal. No respiratory distress. He has no wheezes. He has no rales.  Abdominal: Soft. Bowel sounds are normal. He exhibits no distension. There is no tenderness. There is no guarding.  Musculoskeletal: Normal range of motion. He exhibits no edema.  Lymphadenopathy:    He has no cervical adenopathy.  Neurological: He is alert. He displays no tremor and normal reflexes. He exhibits normal muscle tone. Coordination normal.  Reflex Scores:      Patellar reflexes are 1+ on the right side  and 1+ on the left side. Skin: Skin is warm and dry. No rash noted. He is not diaphoretic. No erythema. No pallor.  No nailbed pallor  Psychiatric: He has a normal mood and affect. His behavior is normal. Judgment and thought content normal. His mood appears not anxious. He does not exhibit a depressed mood.    Assessment/Plan:   Problem List Items Addressed This Visit      Other   Obesity, Class I, BMI 30-34.9    Encouraged pt to work on weight loss      Annual physical exam    USPSTF grade A and B recommendations reviewed with patient; age-appropriate recommendations, preventive care, screening tests, etc discussed and encouraged; healthy living  encouraged; see AVS for patient education given to patient      Relevant Orders   TSH   Lipid panel   CBC with Differential/Platelet   COMPLETE METABOLIC PANEL WITH GFR    Other Visit Diagnoses   None.     No orders of the defined types were placed in this encounter.  Orders Placed This Encounter  Procedures  . TSH    Standing Status:   Future    Standing Expiration Date:   06/06/2016  . Lipid panel    Standing Status:   Future    Standing Expiration Date:   06/06/2016  . CBC with Differential/Platelet    Standing Status:   Future    Standing Expiration Date:   06/06/2016  . COMPLETE METABOLIC PANEL WITH GFR    Standing Status:   Future    Standing Expiration Date:   06/06/2016    Follow up plan: Return in about 1 year (around 01/03/2017) for complete physical.  An After Visit Summary was printed and given to the patient.

## 2016-01-04 NOTE — Assessment & Plan Note (Signed)
Encouraged pt to work on weight loss 

## 2016-01-04 NOTE — Assessment & Plan Note (Signed)
USPSTF grade A and B recommendations reviewed with patient; age-appropriate recommendations, preventive care, screening tests, etc discussed and encouraged; healthy living encouraged; see AVS for patient education given to patient  

## 2016-01-04 NOTE — Patient Instructions (Addendum)
Check out the information at familydoctor.org entitled "Nutrition for Weight Loss: What You Need to Know about Fad Diets" Try to lose between 1-2 pounds per week by taking in fewer calories and burning off more calories You can succeed by limiting portions, limiting foods dense in calories and fat, becoming more active, and drinking 8 glasses of water a day (64 ounces) Don't skip meals, especially breakfast, as skipping meals may alter your metabolism Do not use over-the-counter weight loss pills or gimmicks that claim rapid weight loss A healthy BMI (or body mass index) is between 18.5 and 24.9 You can calculate your ideal BMI at the NIH website [employer]  Return in January for fasting labs Health Maintenance, Male A healthy lifestyle and preventative care can promote health and wellness.  Maintain regular health, dental, and eye exams.  Eat a healthy diet. Foods like vegetables, fruits, whole grains, low-fat dairy products, and lean protein foods contain the nutrients you need and are low in calories. Decrease your intake of foods high in solid fats, added sugars, and salt. Get information about a proper diet from your health care provider, if necessary.  Regular physical exercise is one of the most important things you can do for your health. Most adults should get at least 150 minutes of moderate-intensity exercise (any activity that increases your heart rate and causes you to sweat) each week. In addition, most adults need muscle-strengthening exercises on 2 or more days a week.   Maintain a healthy weight. The body mass index (BMI) is a screening tool to identify possible weight problems. It provides an estimate of body fat based on height and weight. Your health care provider can find your BMI and can help you achieve or maintain a healthy weight. For males 20 years and older:  A BMI below 18.5 is considered underweight.  A BMI  of 18.5 to 24.9 is normal.  A BMI of 25 to 29.9 is considered overweight.  A BMI of 30 and above is considered obese.  Maintain normal blood lipids and cholesterol by exercising and minimizing your intake of saturated fat. Eat a balanced diet with plenty of fruits and vegetables. Blood tests for lipids and cholesterol should begin at age 1 and be repeated every 5 years. If your lipid or cholesterol levels are high, you are over age 31, or you are at high risk for heart disease, you may need your cholesterol levels checked more frequently.Ongoing high lipid and cholesterol levels should be treated with medicines if diet and exercise are not working.  If you smoke, find out from your health care provider how to quit. If you do not use tobacco, do not start.  Lung cancer screening is recommended for adults aged 55-80 years who are at high risk for developing lung cancer because of a history of smoking. A yearly low-dose CT scan of the lungs is recommended for people who have at least a 30-pack-year history of smoking and are current smokers or have quit within the past 15 years. A pack year of smoking is smoking an average of 1 pack of cigarettes a day for 1 year (for example, a 30-pack-year history of smoking could mean smoking 1 pack a day for 30 years or 2 packs a day for 15 years). Yearly screening should continue until the smoker has stopped smoking for at least 15 years. Yearly screening should be stopped for people who develop a health problem that would prevent them from having lung cancer treatment.  If  you choose to drink alcohol, do not have more than 2 drinks per day. One drink is considered to be 12 oz (360 mL) of beer, 5 oz (150 mL) of wine, or 1.5 oz (45 mL) of liquor.  Avoid the use of street drugs. Do not share needles with anyone. Ask for help if you need support or instructions about stopping the use of drugs.  High blood pressure causes heart disease and increases the risk of  stroke. High blood pressure is more likely to develop in:  People who have blood pressure in the end of the normal range (100-139/85-89 mm Hg).  People who are overweight or obese.  People who are African American.  If you are 29-65 years of age, have your blood pressure checked every 3-5 years. If you are 63 years of age or older, have your blood pressure checked every year. You should have your blood pressure measured twice--once when you are at a hospital or clinic, and once when you are not at a hospital or clinic. Record the average of the two measurements. To check your blood pressure when you are not at a hospital or clinic, you can use:  An automated blood pressure machine at a pharmacy.  A home blood pressure monitor.  If you are 63-38 years old, ask your health care provider if you should take aspirin to prevent heart disease.  Diabetes screening involves taking a blood sample to check your fasting blood sugar level. This should be done once every 3 years after age 74 if you are at a normal weight and without risk factors for diabetes. Testing should be considered at a younger age or be carried out more frequently if you are overweight and have at least 1 risk factor for diabetes.  Colorectal cancer can be detected and often prevented. Most routine colorectal cancer screening begins at the age of 1 and continues through age 38. However, your health care provider may recommend screening at an earlier age if you have risk factors for colon cancer. On a yearly basis, your health care provider may provide home test kits to check for hidden blood in the stool. A small camera at the end of a tube may be used to directly examine the colon (sigmoidoscopy or colonoscopy) to detect the earliest forms of colorectal cancer. Talk to your health care provider about this at age 16 when routine screening begins. A direct exam of the colon should be repeated every 5-10 years through age 4, unless early  forms of precancerous polyps or small growths are found.  People who are at an increased risk for hepatitis B should be screened for this virus. You are considered at high risk for hepatitis B if:  You were born in a country where hepatitis B occurs often. Talk with your health care provider about which countries are considered high risk.  Your parents were born in a high-risk country and you have not received a shot to protect against hepatitis B (hepatitis B vaccine).  You have HIV or AIDS.  You use needles to inject street drugs.  You live with, or have sex with, someone who has hepatitis B.  You are a man who has sex with other men (MSM).  You get hemodialysis treatment.  You take certain medicines for conditions like cancer, organ transplantation, and autoimmune conditions.  Hepatitis C blood testing is recommended for all people born from [birth year] through 1965 and any individual with known risk factors for hepatitis C.  Healthy men should no longer receive prostate-specific antigen (PSA) blood tests as part of routine cancer screening. Talk to your health care provider about prostate cancer screening.  Testicular cancer screening is not recommended for adolescents or adult males who have no symptoms. Screening includes self-exam, a health care provider exam, and other screening tests. Consult with your health care provider about any symptoms you have or any concerns you have about testicular cancer.  Practice safe sex. Use condoms and avoid high-risk sexual practices to reduce the spread of sexually transmitted infections (STIs).  You should be screened for STIs, including gonorrhea and chlamydia if:  You are sexually active and are younger than 24 years.  You are older than 24 years, and your health care provider tells you that you are at risk for this type of infection.  Your sexual activity has changed since you were last screened, and you are at an increased risk for chlamydia  or gonorrhea. Ask your health care provider if you are at risk.  If you are at risk of being infected with HIV, it is recommended that you take a prescription medicine daily to prevent HIV infection. This is called pre-exposure prophylaxis (PrEP). You are considered at risk if:  You are a man who has sex with other men (MSM).  You are a heterosexual man who is sexually active with multiple partners.  You take drugs by injection.  You are sexually active with a partner who has HIV.  Talk with your health care provider about whether you are at high risk of being infected with HIV. If you choose to begin PrEP, you should first be tested for HIV. You should then be tested every 3 months for as long as you are taking PrEP.  Use sunscreen. Apply sunscreen liberally and repeatedly throughout the day. You should seek shade when your shadow is shorter than you. Protect yourself by wearing long sleeves, pants, a wide-brimmed hat, and sunglasses year round whenever you are outdoors.  Tell your health care provider of new moles or changes in moles, especially if there is a change in shape or color. Also, tell your health care provider if a mole is larger than the size of a pencil eraser.  A one-time screening for abdominal aortic aneurysm (AAA) and surgical repair of large AAAs by ultrasound is recommended for men aged 65-75 years who are current or former smokers.  Stay current with your vaccines (immunizations).   This information is not intended to replace advice given to you by your health care provider. Make sure you discuss any questions you have with your health care provider.   Document Released: 08/23/2007 Document Revised: 03/17/2014 Document Reviewed: 07/22/2010 Elsevier Interactive Patient Education [employer]2016 [employer] Inc.

## 2016-02-04 ENCOUNTER — Telehealth: Payer: Self-pay | Admitting: Family Medicine

## 2016-02-04 ENCOUNTER — Other Ambulatory Visit: Payer: Self-pay

## 2016-02-04 NOTE — Telephone Encounter (Signed)
Pt [facility]

## 2016-02-05 ENCOUNTER — Other Ambulatory Visit: Payer: Self-pay | Admitting: Family Medicine

## 2016-02-05 ENCOUNTER — Encounter: Payer: Self-pay | Admitting: Family Medicine

## 2016-02-05 MED ORDER — BENAZEPRIL HCL 5 MG PO TABS: 5.0000 mg | ORAL_TABLET | Freq: Every day | ORAL | 0 refills | Status: DC

## 2016-02-06 MED ORDER — BENAZEPRIL HCL 5 MG PO TABS: 5.0000 mg | ORAL_TABLET | Freq: Every day | ORAL | 1 refills | Status: DC

## 2016-10-29 ENCOUNTER — Encounter: Payer: Self-pay | Admitting: Family Medicine

## 2016-10-29 NOTE — Progress Notes (Signed)
Patient never had labs done Canceling orders

## 2018-12-08 ENCOUNTER — Ambulatory Visit (INDEPENDENT_AMBULATORY_CARE_PROVIDER_SITE_OTHER): Payer: BC Managed Care – PPO

## 2018-12-08 ENCOUNTER — Ambulatory Visit
Admission: EM | Admit: 2018-12-08 | Discharge: 2018-12-08 | Disposition: A | Discharge status: Home / Self Care | Payer: BC Managed Care – PPO | Attending: Emergency Medicine | Admitting: Emergency Medicine

## 2018-12-08 ENCOUNTER — Other Ambulatory Visit: Payer: Self-pay

## 2018-12-08 DIAGNOSIS — M5431 Sciatica, right side: Secondary | ICD-10-CM | POA: Diagnosis not present

## 2018-12-08 DIAGNOSIS — M545 Low back pain: Secondary | ICD-10-CM | POA: Diagnosis not present

## 2018-12-08 MED ORDER — TIZANIDINE HCL 4 MG PO TABS: 4.0000 mg | ORAL_TABLET | Freq: Three times a day (TID) | ORAL | 0 refills | Status: DC | PRN

## 2018-12-08 MED ORDER — MELOXICAM 15 MG PO TABS: 15.0000 mg | ORAL_TABLET | Freq: Every day | ORAL | 0 refills | Status: AC

## 2018-12-08 MED ORDER — METHYLPREDNISOLONE 4 MG PO TBPK: ORAL_TABLET | Freq: Every day | ORAL | 0 refills | Status: DC

## 2018-12-08 NOTE — ED Provider Notes (Signed)
. HPI  SUBJECTIVE:  Robert Vang is a 43 y.o. male who presents with 4 months of intermittent right-sided back and buttock pain that lasts up to weeks.  States this started in late May, early June.  He reports having a fall onto concrete in late March/early April onto his right back and buttock, but states he did not have any pain for at least several weeks afterwards.  He now describes the pain as stabbing, squeezing, as if he is being stabbed with a "hot and cold pitchfork".  He has seen his PMD, was given stretches, Aleve 2 tabs twice a day, ibuprofen 800 mg twice a day and has also seen a chiropractor.  He states that the Aleve and the chiropractor helped temporarily.  Symptoms are worse with weightbearing right foot.  He has also tried massage/pressure points with improvement in his symptoms.  States that he is sleeping on a relatively new mattress. denies N/V, fevers, flank pain, abdominal pain, urinary urgency, frequency, dysuria, cloudy or odorous urine, hematuria.  No syncope. No saddle anesthesia, distal weakness/numbness, bilateral radicular leg pain/weakness,  recent h/o trauma, neurological deficits,  bladder/ bowel incontinence, urinary retention, h/o CA / multiple myleoma, unexplained weight loss, pain worse at night,  h/o prolonged steroid use, h/o osteopenia, h/o IVDU, h/o HIV,  known AAA. no h/o pyelonephritis, nephrolithiasis.  No history of back injury, sciatica, GI bleed, peptic ulcer disease, diabetes.  He has a history of hypertension.  PMD: Dr. Marolyn Haller at [facility].    Past Medical History:  Diagnosis Date  . Allergic rhinitis, seasonal   . Allergy   . Elevated liver enzymes   . HTN, goal below 140/90   . Migraine headache   . Obesity, Class I, BMI 30-34.9     History reviewed. No pertinent surgical history.  Family History  Problem Relation Age of Onset  . Hypertension Mother   . Diabetes Mother   . Hypertension Father   . Cancer Maternal Uncle        colon  .  Diabetes Maternal Grandfather     Social History   Tobacco Use  . Smoking status: Former Smoker    Packs/day: 0.10    Years: 10.00    Pack years: 1.00    Types: Cigarettes  . Smokeless tobacco: Current User    Types: Chew  Substance Use Topics  . Alcohol use: Yes    Alcohol/week: 6.0 standard drinks    Types: 6 Cans of beer per week    Comment: ocassionally  . Drug use: No    No current facility-administered medications for this encounter.   Current Outpatient Medications:  .  benazepril (LOTENSIN) 5 MG tablet, Take 1 tablet (5 mg total) by mouth daily. (labs needed please for further refills), Disp: 30 tablet, Rfl: 1 .  meloxicam (MOBIC) 15 MG tablet, Take 1 tablet (15 mg total) by mouth daily for 10 days., Disp: 10 tablet, Rfl: 0 .  methylPREDNISolone (MEDROL DOSEPAK) 4 MG TBPK tablet, Take by mouth daily. Follow package instructions, Disp: 21 tablet, Rfl: 0 .  tiZANidine (ZANAFLEX) 4 MG tablet, Take 1 tablet (4 mg total) by mouth every 8 (eight) hours as needed for muscle spasms., Disp: 30 tablet, Rfl: 0  No Known Allergies   ROS  As noted in HPI.   Physical Exam  BP (!) 132/91 (BP Location: Right Arm)   Pulse 62   Temp 98.4 F (36.9 C) (Oral)   Resp 17   Ht 5\' 11"  (1.803  m)   Wt 96.2 kg   SpO2 100%   BMI 29.57 kg/m   Constitutional: Well developed, well nourished, no acute distress Eyes:  EOMI, conjunctiva normal bilaterally HENT: Normocephalic, atraumatic,mucus membranes moist Respiratory: Normal inspiratory effort Cardiovascular: Normal rate GI: nondistended. No suprapubic or flank tenderness skin: No rash, skin intact Musculoskeletal: no CVAT. -  paralumbar tenderness, - muscle spasm.  Tenderness at the right QL, bony tenderness at L5/S1.  Tenderness at the right sciatic notch. Bilateral lower extremities nontender, baseline ROM with intact DP pulses, CR<2 secs all digits bilaterally. No pain with int/ext rotation flex/extension hips bilaterally. SLR neg  bilaterally. Sensation baseline light touch bilaterally for Pt, DTR's symmetric and intact bilaterally KJ, Motor symmetric bilateral 5/5 hip flexion, quadriceps, hamstrings, EHL, foot dorsiflexion, foot plantarflexion, gait normal.   Neurologic: Alert & oriented x 3, no focal neuro deficits Psychiatric: Speech and behavior appropriate   ED Course   Medications - No data to display  Orders Placed This Encounter  Procedures  . DG Lumbar Spine Complete    Standing Status:   Standing    Number of Occurrences:   1    Order Specific Question:   Reason for Exam (SYMPTOM  OR DIAGNOSIS REQUIRED)    Answer:   4 months R LBP with sciatica    No results found for this or any previous visit (from the past 24 hour(s)). Dg Lumbar Spine Complete  Result Date: 12/08/2018 CLINICAL DATA:  Low back and right leg pain for 3 months. EXAM: LUMBAR SPINE - COMPLETE 4+ VIEW COMPARISON:  None. FINDINGS: Normal alignment on the lateral film. Disc spaces and vertebral bodies are fairly well maintained. Marginal osteophytes noted at L3, L4 and L5. Mild lower lumbar facet disease but no pars defects. No acute bony findings or destructive bony changes. The visualized bony pelvis is intact. The SI joints appear normal. IMPRESSION: Mild degenerative changes but no acute bony findings. Electronically Signed   By: Rudie MeyerP.  Gallerani M.D.   On: 12/08/2018 17:30    ED Clinical Impression  1. Sciatica of right side      ED Assessment/Plan  Obtaining imaging due to duration of symptoms.  Suspect sciatica.  Reviewed imaging independently.  Marginal osteophytes at L3, L4, L5.  Mild lower lumbar facet disease but no pars deficits.  No acute bony findings.  See radiology report for details.  will send home with meloxicam 15 mg daily for 10 days, with 1 g Tylenol, may take an additional gram of Tylenol 2 or 3 more times a day for total of 4 g of Tylenol in 1 day, Medrol Dosepak, Zanaflex as needed.  Continue deep tissue massage.   Follow-up with orthopedics if not better in a week or 2.  Discussed imaging, medical decision-making, and plan for follow-up with the patient.  Discussed signs and symptoms that should prompt return to the emergency department.  Patient agrees with plan.  Meds ordered this encounter  Medications  . tiZANidine (ZANAFLEX) 4 MG tablet    Sig: Take 1 tablet (4 mg total) by mouth every 8 (eight) hours as needed for muscle spasms.    Dispense:  30 tablet    Refill:  0  . methylPREDNISolone (MEDROL DOSEPAK) 4 MG TBPK tablet    Sig: Take by mouth daily. Follow package instructions    Dispense:  21 tablet    Refill:  0  . meloxicam (MOBIC) 15 MG tablet    Sig: Take 1 tablet (15 mg total) by  mouth daily for 10 days.    Dispense:  10 tablet    Refill:  0    *This clinic note was created using [employer]. Therefore, there may be occasional mistakes despite careful proofreading.  ?     Melynda Ripple, MD 12/08/18 1744

## 2018-12-08 NOTE — ED Triage Notes (Signed)
Patient complains of low back pain that started in may/june and he did stretches, states that he saw his primary MD for physical and he did not provide any help. Saw Chiropractor recently and did a full exam and he felt better for a little while. Patient states that pain radiates down his right leg.

## 2018-12-08 NOTE — Discharge Instructions (Addendum)
Take the meloxicam 15 mg daily as needed for up to 10 days, with 1 g of Tylenol, may take an additional gram of Tylenol 2 or 3 more times a day for total of 4 g of Tylenol in 1 day. Medrol Dosepak is a steroid.  Take this every day as directed for the next 6 days. Zanaflex as a muscle relaxant, take as needed.  Continue deep tissue massage.  Follow-up with orthopedics if not better in a week or 2.

## 2019-11-04 ENCOUNTER — Encounter: Payer: Self-pay | Admitting: Emergency Medicine

## 2019-11-04 ENCOUNTER — Ambulatory Visit
Admission: EM | Admit: 2019-11-04 | Discharge: 2019-11-04 | Disposition: A | Discharge status: Home / Self Care | Payer: BLUE CROSS/BLUE SHIELD

## 2019-11-04 ENCOUNTER — Other Ambulatory Visit: Payer: Self-pay

## 2019-11-04 DIAGNOSIS — L509 Urticaria, unspecified: Secondary | ICD-10-CM

## 2019-11-04 MED ORDER — METHYLPREDNISOLONE 4 MG PO TBPK: ORAL_TABLET | ORAL | 0 refills | Status: DC

## 2019-11-04 NOTE — Discharge Instructions (Addendum)
Follow up with your family Dr in 7-10 days. You may need  to see an allergist

## 2019-11-04 NOTE — ED Provider Notes (Signed)
[facility]    CSN: [id number] Arrival date & time: 11/04/19  1731      History   Chief Complaint Chief Complaint  Patient presents with  . Rash    HPI Robert Vang is a 44 y.o. male. with rash and itching 2.5-3 weeks ago which started on his feet, and after staying in his sister's home for 4 days who uses the same detergent as him, his body started itching and developing hives.   Denies any new meds or foods, cleaners or lotions.  Has tried several things OTC which has not helped. Been taking Benadryl elixir 25 mg bid.   Past Medical History:  Diagnosis Date  . Allergic rhinitis, seasonal   . Allergy   . Elevated liver enzymes   . HTN, goal below 140/90   . Migraine headache   . Obesity, Class I, BMI 30-34.9     Patient Active Problem List   Diagnosis Date Noted  . Right-sided chest pain 03/07/2015  . Hypertension goal BP (blood pressure) < 140/90 10/24/2014  . Migraine without aura and without status migrainosus, not intractable 10/24/2014  . Obesity, Class I, BMI 30-34.9 10/24/2014  . Screening cholesterol level 10/24/2014  . Need for immunization against influenza 10/24/2014  . Annual physical exam 10/24/2014    History reviewed. No pertinent surgical history.   Home Medications    Prior to Admission medications   Medication Sig Start Date End Date Taking? Authorizing Provider  benazepril (LOTENSIN) 5 MG tablet Take 1 tablet (5 mg total) by mouth daily. (labs needed please for further refills) 02/06/16  Yes Lada, Janit Bern, MD  Erenumab-aooe (AIMOVIG) 140 MG/ML SOAJ Inject into the skin. 10/28/19  Yes [provider]  methylPREDNISolone (MEDROL DOSEPAK) 4 MG TBPK tablet Take by mouth daily. Follow package instructions 12/08/18   Domenick Gong, MD  tiZANidine (ZANAFLEX) 4 MG tablet Take 1 tablet (4 mg total) by mouth every 8 (eight) hours as needed for muscle spasms. 12/08/18   Domenick Gong, MD  EQ ALLERGY RELIEF 10 MG tablet TAKE ONE  TABLET BY MOUTH ONCE DAILY 05/03/15 12/08/18  Edwena Felty, MD  SUMAtriptan (IMITREX) 50 MG tablet Take 1 tablet by mouth at onset of headache, may repeat x1 two hrs later 10/31/15 12/08/18  Kerman Passey, MD    Family History Family History  Problem Relation Age of Onset  . Hypertension Mother   . Diabetes Mother   . Hypertension Father   . Cancer Maternal Uncle        colon  . Diabetes Maternal Grandfather     Social History Social History   Tobacco Use  . Smoking status: Former Smoker    Packs/day: 0.10    Years: 10.00    Pack years: 1.00    Types: Cigarettes  . Smokeless tobacco: Current User    Types: Chew  Vaping Use  . Vaping Use: Never used  Substance Use Topics  . Alcohol use: Yes    Alcohol/week: 6.0 standard drinks    Types: 6 Cans of beer per week    Comment: ocassionally  . Drug use: No   Allergies   Patient has no known allergies.  Review of Systems Review of Systems  Constitutional: Negative for activity change, appetite change, chills and fever.  Eyes: Negative for discharge.  Respiratory: Negative for cough, shortness of breath and wheezing.   Gastrointestinal: Negative for abdominal pain.  Musculoskeletal: Negative for arthralgias, gait problem and joint swelling.  Skin: Positive for  rash.       + proritus  Neurological: Negative for numbness and headaches.  Hematological: Negative for adenopathy.   Physical Exam Triage Vital Signs ED Triage Vitals  Enc Vitals Group     BP 11/04/19 1818 (!) 130/96     Pulse Rate 11/04/19 1818 66     Resp 11/04/19 1818 16     Temp 11/04/19 1818 98.5 F (36.9 C)     Temp Source 11/04/19 1818 Oral     SpO2 11/04/19 1818 100 %     Weight 11/04/19 1813 214 lb (97.1 kg)     Height 11/04/19 1813 5\' 11"  (1.803 m)     Head Circumference --      Peak Flow --      Pain Score 11/04/19 1813 0     Pain Loc --      Pain Edu? --      Excl. in GC? --    No data found.  Updated Vital Signs BP (!) 130/96 (BP  Location: Right Arm)   Pulse 66   Temp 98.5 F (36.9 C) (Oral)   Resp 16   Ht 5\' 11"  (1.803 m)   Wt 214 lb (97.1 kg)   SpO2 100%   BMI 29.85 kg/m   Visual Acuity Right Eye Distance:   Left Eye Distance:   Bilateral Distance:    Right Eye Near:   Left Eye Near:    Bilateral Near:     Physical Exam Skin:    Comments: Different shape small hives on arms and legs and mid ab.    UC Treatments / Results  Labs (all labs ordered are listed, but only abnormal results are displayed) Labs Reviewed - No data to display  EKG   Radiology No results found.  Procedures Procedures (including critical care time)  Medications Ordered in UC Medications - No data to display  Initial Impression / Assessment and Plan / UC Course  I have reviewed the triage vital signs and the nursing notes. Has urticaria of unknown cause. I placed him on Medrol dose pain and may continue Benadryl and needs to Fu with his PCP.   Final Clinical Impressions(s) / UC Diagnoses   Final diagnoses:  None   Discharge Instructions   None    ED Prescriptions    None     PDMP not reviewed this encounter.   11/06/19, 11/04/19 2040

## 2019-11-04 NOTE — ED Triage Notes (Signed)
Patient c/o itchy red bumps on his arms, chest and legs that started over a week ago.

## 2020-05-24 ENCOUNTER — Ambulatory Visit
Admission: EM | Admit: 2020-05-24 | Discharge: 2020-05-24 | Disposition: A | Discharge status: Home / Self Care | Payer: BC Managed Care – PPO | Attending: Family Medicine | Admitting: Family Medicine

## 2020-05-24 ENCOUNTER — Other Ambulatory Visit: Payer: Self-pay

## 2020-05-24 DIAGNOSIS — M542 Cervicalgia: Secondary | ICD-10-CM

## 2020-05-24 NOTE — ED Triage Notes (Signed)
Patient states that he has some right sided neck swelling and pain x 3 days. States that he is concerned it is a lymphnode.

## 2020-05-24 NOTE — ED Provider Notes (Signed)
[facility]    CSN: [id number] Arrival date & time: 05/24/20  0936      History   Chief Complaint Chief Complaint  Patient presents with  . Neck Pain   HPI  45 year old male presents with the above complaint.  Patient reports right-sided neck pain for the past 2 to 3 days.  He is concerned he may have a lymph node.  He states that the area is tender.  He endorses pain when he lays on the area.  He is otherwise feeling well.  He denies any significant sore throat.  No respiratory symptoms.  No fever.  He was advised to come in for evaluation by his wife.  No relieving factors.  No other complaints.  Past Medical History:  Diagnosis Date  . Allergic rhinitis, seasonal   . Allergy   . Elevated liver enzymes   . HTN, goal below 140/90   . Migraine headache   . Obesity, Class I, BMI 30-34.9     Patient Active Problem List   Diagnosis Date Noted  . Right-sided chest pain 03/07/2015  . Hypertension goal BP (blood pressure) < 140/90 10/24/2014  . Migraine without aura and without status migrainosus, not intractable 10/24/2014  . Obesity, Class I, BMI 30-34.9 10/24/2014  . Screening cholesterol level 10/24/2014  . Need for immunization against influenza 10/24/2014  . Annual physical exam 10/24/2014    History reviewed. No pertinent surgical history.     Home Medications    Prior to Admission medications   Medication Sig Start Date End Date Taking? Authorizing Provider  AJOVY 225 MG/1.5ML SOSY Inject into the skin. 05/09/20  Yes [provider]  benazepril (LOTENSIN) 10 MG tablet Take 10 mg by mouth daily. 04/22/20  Yes [provider]  indomethacin (INDOCIN) 50 MG capsule Take 50 mg by mouth 2 (two) times daily as needed. 04/27/20  Yes [provider]  SUMAtriptan (IMITREX) 100 MG tablet Take 100 mg by mouth 2 (two) times daily as needed. 05/01/20  Yes [provider]  EQ ALLERGY RELIEF 10 MG tablet TAKE ONE TABLET BY MOUTH ONCE  DAILY 05/03/15 12/08/18  Edwena Felty, MD    Family History Family History  Problem Relation Age of Onset  . Hypertension Mother   . Diabetes Mother   . Hypertension Father   . Cancer Maternal Uncle        colon  . Diabetes Maternal Grandfather     Social History Social History   Tobacco Use  . Smoking status: Former Smoker    Packs/day: 0.10    Years: 10.00    Pack years: 1.00    Types: Cigarettes  . Smokeless tobacco: Current User    Types: Chew  Vaping Use  . Vaping Use: Never used  Substance Use Topics  . Alcohol use: Yes    Alcohol/week: 6.0 standard drinks    Types: 6 Cans of beer per week    Comment: ocassionally  . Drug use: No     Allergies   Contrast media [iodinated diagnostic agents]   Review of Systems Review of Systems  Constitutional: Negative.   Musculoskeletal: Positive for neck pain.   Physical Exam Triage Vital Signs ED Triage Vitals  Enc Vitals Group     BP 05/24/20 0954 (!) 136/92     Pulse Rate 05/24/20 0954 73     Resp 05/24/20 0954 18     Temp 05/24/20 0954 98.2 F (36.8 C)     Temp Source 05/24/20  0954 Oral     SpO2 05/24/20 0954 99 %     Weight 05/24/20 0952 222 lb (100.7 kg)     Height 05/24/20 0952 5\' 11"  (1.803 m)     Head Circumference --      Peak Flow --      Pain Score 05/24/20 0952 6     Pain Loc --      Pain Edu? --      Excl. in GC? --    Updated Vital Signs BP (!) 136/92 (BP Location: Left Arm)   Pulse 73   Temp 98.2 F (36.8 C) (Oral)   Resp 18   Ht 5\' 11"  (1.803 m)   Wt 100.7 kg   SpO2 99%   BMI 30.96 kg/m   Visual Acuity Right Eye Distance:   Left Eye Distance:   Bilateral Distance:    Right Eye Near:   Left Eye Near:    Bilateral Near:     Physical Exam Vitals and nursing note reviewed.  Constitutional:      General: He is not in acute distress.    Appearance: Normal appearance. He is not ill-appearing.  HENT:     Head: Normocephalic and atraumatic.     Right Ear: Tympanic membrane  normal.     Left Ear: Tympanic membrane normal.     Mouth/Throat:     Pharynx: Oropharynx is clear. No oropharyngeal exudate or posterior oropharyngeal erythema.  Eyes:     General:        Right eye: No discharge.        Left eye: No discharge.     Conjunctiva/sclera: Conjunctivae normal.  Cardiovascular:     Rate and Rhythm: Normal rate and regular rhythm.     Heart sounds: No murmur heard.   Pulmonary:     Effort: Pulmonary effort is normal.     Breath sounds: Normal breath sounds. No wheezing, rhonchi or rales.  Musculoskeletal:     Cervical back: Neck supple. Tenderness present.  Lymphadenopathy:     Cervical: No cervical adenopathy.  Neurological:     Mental Status: He is alert.  Psychiatric:        Mood and Affect: Mood normal.        Behavior: Behavior normal.    UC Treatments / Results  Labs (all labs ordered are listed, but only abnormal results are displayed) Labs Reviewed - No data to display  EKG   Radiology No results found.  Procedures Procedures (including critical care time)  Medications Ordered in UC Medications - No data to display  Initial Impression / Assessment and Plan / UC Course  I have reviewed the triage vital signs and the nursing notes.  Pertinent labs & imaging results that were available during my care of the patient were reviewed by me and considered in my medical decision making (see chart for details).    45 year old male presents with right-sided neck pain.  I do not feel a discrete lymph node.  He is well-appearing.  His exam is benign.  I advised close monitoring.  This would likely self resolve.  Tylenol and ibuprofen as needed.  Final Clinical Impressions(s) / UC Diagnoses   Final diagnoses:  Neck pain     Discharge Instructions     I don't feel a discrete lymph node.  Keep a close eye.   Tylenol and Ibuprofen as needed.  No need to worry at this time. This should self resolve.  If it does not,  please let us  know.  Dr. Adriana Simas    ED Prescriptions    None     PDMP not reviewed this encounter.   Tommie Sams, [place] 05/24/20 1112

## 2020-05-24 NOTE — Discharge Instructions (Signed)
I don't feel a discrete lymph node.  Keep a close eye.   Tylenol and Ibuprofen as needed.  No need to worry at this time. This should self resolve.  If it does not, please let us know.  Dr. Adriana Simas

## 2020-08-23 ENCOUNTER — Other Ambulatory Visit: Payer: Self-pay | Admitting: Physical Medicine & Rehabilitation

## 2020-08-23 DIAGNOSIS — G8929 Other chronic pain: Secondary | ICD-10-CM

## 2020-09-06 ENCOUNTER — Other Ambulatory Visit: Payer: Self-pay

## 2020-09-06 ENCOUNTER — Ambulatory Visit
Admission: RE | Admit: 2020-09-06 | Discharge: 2020-09-06 | Disposition: A | Discharge status: Home / Self Care | Payer: BC Managed Care – PPO | Source: Ambulatory Visit | Attending: Physical Medicine & Rehabilitation | Admitting: Physical Medicine & Rehabilitation

## 2020-09-06 DIAGNOSIS — M5441 Lumbago with sciatica, right side: Secondary | ICD-10-CM | POA: Diagnosis not present

## 2020-09-06 DIAGNOSIS — G8929 Other chronic pain: Secondary | ICD-10-CM | POA: Insufficient documentation

## 2021-01-06 ENCOUNTER — Ambulatory Visit
Admission: EM | Admit: 2021-01-06 | Discharge: 2021-01-06 | Disposition: A | Discharge status: Home / Self Care | Payer: BC Managed Care – PPO | Attending: Medical Oncology | Admitting: Medical Oncology

## 2021-01-06 ENCOUNTER — Encounter: Payer: Self-pay | Admitting: Medical Oncology

## 2021-01-06 ENCOUNTER — Other Ambulatory Visit: Payer: Self-pay

## 2021-01-06 DIAGNOSIS — J069 Acute upper respiratory infection, unspecified: Secondary | ICD-10-CM | POA: Diagnosis not present

## 2021-01-06 DIAGNOSIS — R0981 Nasal congestion: Secondary | ICD-10-CM

## 2021-01-06 MED ORDER — FLUTICASONE PROPIONATE 50 MCG/ACT NA SUSP: 2.0000 | Freq: Every day | NASAL | 0 refills | Status: AC

## 2021-01-06 MED ORDER — BENZONATATE 100 MG PO CAPS: 100.0000 mg | ORAL_CAPSULE | Freq: Three times a day (TID) | ORAL | 0 refills | Status: AC

## 2021-01-06 MED ORDER — PREDNISONE 10 MG (21) PO TBPK: ORAL_TABLET | Freq: Every day | ORAL | 0 refills | Status: AC

## 2021-01-06 NOTE — ED Provider Notes (Signed)
[facility]    CSN: [id number] Arrival date & time: 01/06/21  1157      History   Chief Complaint Chief Complaint  Patient presents with   Cough   Nasal Congestion    HPI Robert Vang is a 45 y.o. male.   HPI  Cough and nasal congestion: Patient reports about 1 and half weeks ago he was seen for right ear pain and tinnitus.  He was given Augmentin and low-dose steroid for 5 days as he reports that the ear was red.  He states that a few days after this he developed his current symptoms of dry cough, nasal congestion.  He is getting coughing fits.  No fevers, shortness of breath or wheezing.  He reports that his symptoms improved on the steroid medication and when he stopped this medication his symptoms redeveloped about 2 to 3 days later and he is now once again having the right ear pain and tinnitus.  He does have a follow-up visit with ENT scheduled.  Past Medical History:  Diagnosis Date   Allergic rhinitis, seasonal    Allergy    Elevated liver enzymes    HTN, goal below 140/90    Migraine headache    Obesity, Class I, BMI 30-34.9     Patient Active Problem List   Diagnosis Date Noted   Right-sided chest pain 03/07/2015   Hypertension goal BP (blood pressure) < 140/90 10/24/2014   Migraine without aura and without status migrainosus, not intractable 10/24/2014   Obesity, Class I, BMI 30-34.9 10/24/2014   Screening cholesterol level 10/24/2014   Need for immunization against influenza 10/24/2014   Annual physical exam 10/24/2014    History reviewed. No pertinent surgical history.     Home Medications    Prior to Admission medications   Medication Sig Start Date End Date Taking? Authorizing Provider  AJOVY 225 MG/1.5ML SOSY Inject into the skin. 05/09/20  Yes [provider]  benazepril (LOTENSIN) 10 MG tablet Take 10 mg by mouth daily. 04/22/20  Yes [provider]  SUMAtriptan (IMITREX) 100 MG tablet Take 100 mg by mouth 2 (two)  times daily as needed. 05/01/20  Yes [provider]  indomethacin (INDOCIN) 50 MG capsule Take 50 mg by mouth 2 (two) times daily as needed. 04/27/20   [provider]  EQ ALLERGY RELIEF 10 MG tablet TAKE ONE TABLET BY MOUTH ONCE DAILY 05/03/15 12/08/18  Edwena Felty, MD    Family History Family History  Problem Relation Age of Onset   Hypertension Mother    Diabetes Mother    Hypertension Father    Cancer Maternal Uncle        colon   Diabetes Maternal Grandfather     Social History Social History   Tobacco Use   Smoking status: Former    Packs/day: 0.10    Years: 10.00    Pack years: 1.00    Types: Cigarettes   Smokeless tobacco: Current    Types: Chew  Vaping Use   Vaping Use: Never used  Substance Use Topics   Alcohol use: Yes    Alcohol/week: 6.0 standard drinks    Types: 6 Cans of beer per week    Comment: ocassionally   Drug use: No     Allergies   Contrast media [iodinated diagnostic agents]   Review of Systems Review of Systems  As stated above in HPI Physical Exam Triage Vital Signs ED Triage Vitals  Enc Vitals Group  BP 01/06/21 1208 131/88     Pulse Rate 01/06/21 1208 (!) 111     Resp 01/06/21 1208 16     Temp 01/06/21 1208 98.4 F (36.9 C)     Temp Source 01/06/21 1208 Oral     SpO2 01/06/21 1208 98 %     Weight --      Height --      Head Circumference --      Peak Flow --      Pain Score 01/06/21 1206 3     Pain Loc --      Pain Edu? --      Excl. in GC? --    No data found.  Updated Vital Signs BP 131/88 (BP Location: Right Arm)   Pulse (!) 111   Temp 98.4 F (36.9 C) (Oral)   Resp 16   SpO2 98%   Physical Exam Vitals and nursing note reviewed.  Constitutional:      General: He is not in acute distress.    Appearance: Normal appearance. He is not ill-appearing, toxic-appearing or diaphoretic.     Comments: Pt sounds congested  HENT:     Head: Normocephalic and atraumatic.     Ears:     Comments:  Clear bilateral middle ear effusion without erythema    Nose: Congestion (without sinus boggines or tenderness to plapation but with mild effusion) present.  Eyes:     Extraocular Movements: Extraocular movements intact.     Conjunctiva/sclera: Conjunctivae normal.     Pupils: Pupils are equal, round, and reactive to light.  Cardiovascular:     Rate and Rhythm: Normal rate and regular rhythm.     Heart sounds: Normal heart sounds.  Pulmonary:     Effort: Pulmonary effort is normal.     Breath sounds: Normal breath sounds. No wheezing or rhonchi.     Comments: Deep breathing during examination causes dry coughing fits Musculoskeletal:     Cervical back: Normal range of motion and neck supple.  Lymphadenopathy:     Cervical: No cervical adenopathy.  Skin:    General: Skin is warm.  Neurological:     Mental Status: He is alert and oriented to person, place, and time.     UC Treatments / Results  Labs (all labs ordered are listed, but only abnormal results are displayed) Labs Reviewed - No data to display  EKG   Radiology No results found.  Procedures Procedures (including critical care time)  Medications Ordered in UC Medications - No data to display  Initial Impression / Assessment and Plan / UC Course  I have reviewed the triage vital signs and the nursing notes.  Pertinent labs & imaging results that were available during my care of the patient were reviewed by me and considered in my medical decision making (see chart for details).     New.  I discussed with patient that this appears to be inflammation likely starting from a viral illness.  I have recommended another round of prednisone but for a longer as well as Tessalon and Flonase to help with the symptoms.  ENT follow-up if symptoms are unchanged or worsen.  Final Clinical Impressions(s) / UC Diagnoses   Final diagnoses:  None   Discharge Instructions   None    ED Prescriptions   None    PDMP not  reviewed this encounter.   Rushie Chestnut, [place] 01/06/21 1313

## 2021-01-06 NOTE — ED Triage Notes (Signed)
Pt c/o ear ringing, chest congestion, cough. Was sick 10 days ago with same thing out on steroids and antibiotics. Sxs returned yesterday with cough ringing in right ear, congestion.

## 2021-01-17 ENCOUNTER — Other Ambulatory Visit: Payer: Self-pay | Admitting: Otolaryngology

## 2021-01-17 DIAGNOSIS — H9311 Tinnitus, right ear: Secondary | ICD-10-CM

## 2021-12-01 IMAGING — MR MR LUMBAR SPINE W/O CM
5 series · 31 of 48 positions shown · non-contrast
Comparison: Lumbar radiographs 11/11/2018.

CLINICAL DATA: Low back pain.  Right-sided sciatica.  Technique

EXAM:
MRI LUMBAR SPINE WITHOUT CONTRAST
TECHNIQUE: Multiplanar, multisequence MR imaging of the lumbar spine was
performed. No intravenous contrast was administered.

[Series 5: T2 · sagittal · 4.0mm · 0.81mm/px · 7 of 17 slices shown (1 of 2)]
[im 1/17]
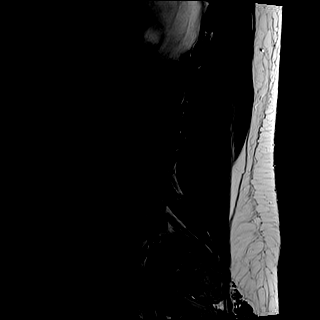
[im 3/17]
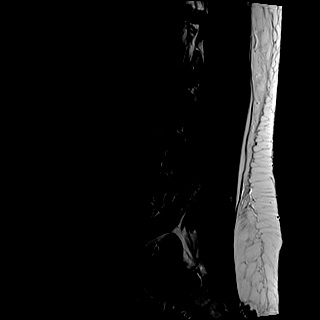
[im 6/17]
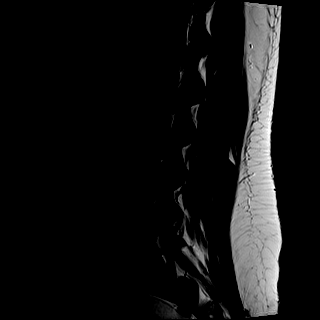
[im 9/17]
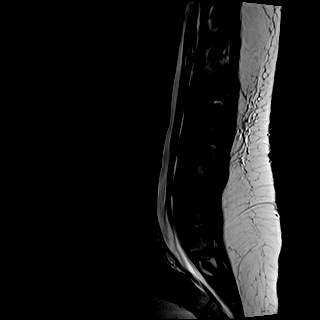
[im 11/17]
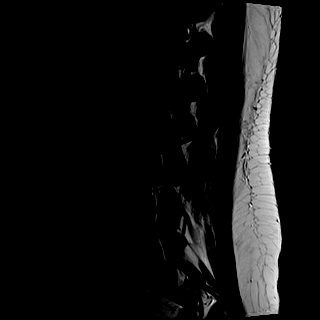
[im 14/17]
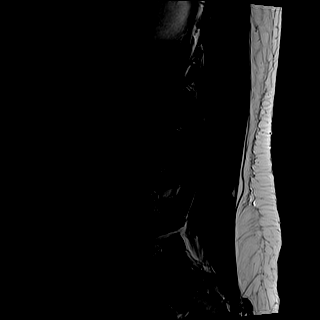
[im 17/17]
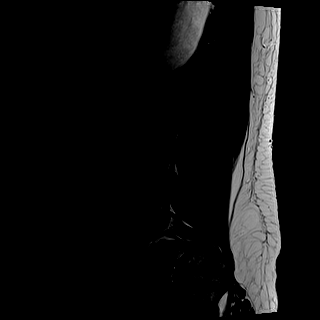

[Series 6: T1 · sagittal · 4.0mm · 0.81mm/px · 7 of 17 slices shown (1 of 2)]
[im 1/17]
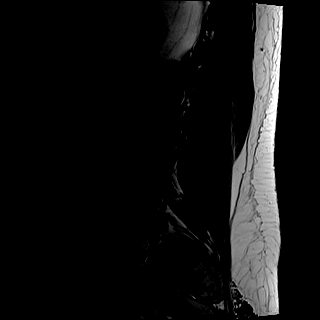
[im 3/17]
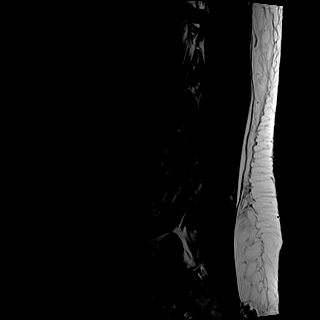
[im 6/17]
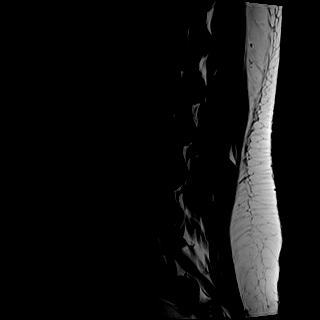
[im 9/17]
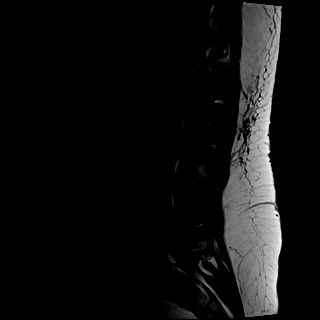
[im 11/17]
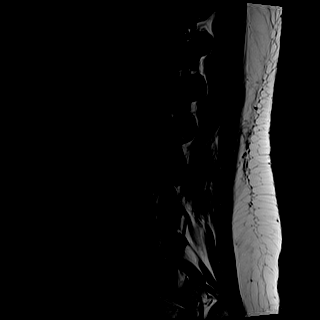
[im 14/17]
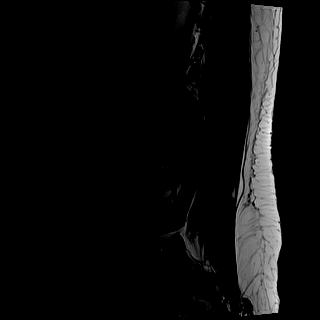
[im 17/17]
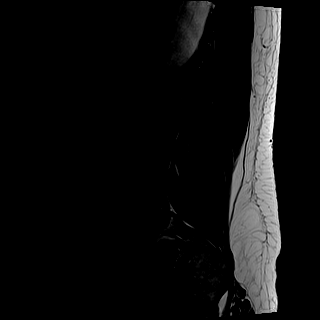

[Series 7: STIR · sagittal · 4.0mm · 0.41mm/px · 1 of 17 slices shown]
[im 1/17]
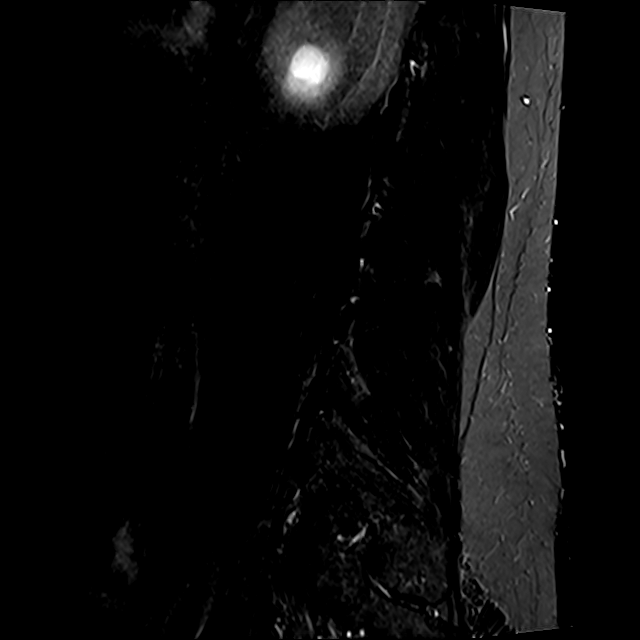

[Series 8: T2 · axial · 4.0mm · 0.78mm/px · z∈[-108,+116]mm · 8 of 38 slices shown (2 of 2)]
[im 1/38]
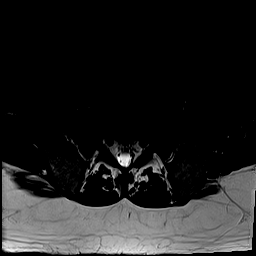
[im 6/38]
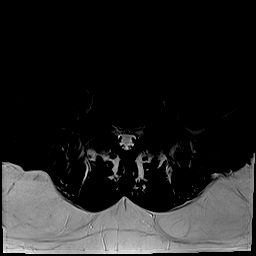
[im 12/38]
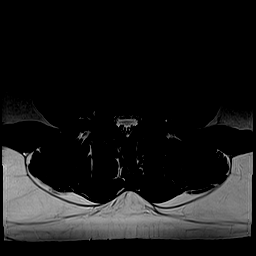
[im 18/38]
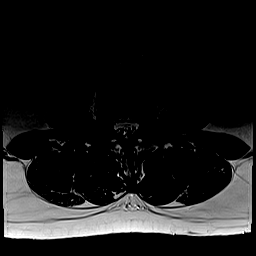
[im 20/38]
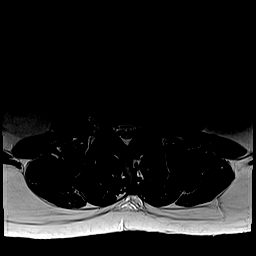
[im 26/38]
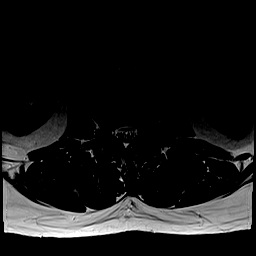
[im 32/38]
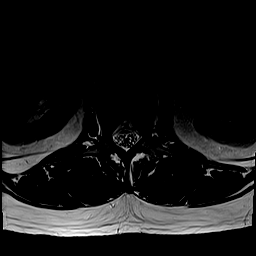
[im 38/38]
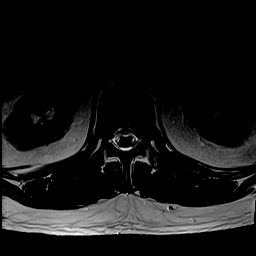

[Series 9: T1 · axial · 4.0mm · 0.39mm/px · z∈[-108,+116]mm · 8 of 38 slices shown (2 of 2)]
[im 1/38]
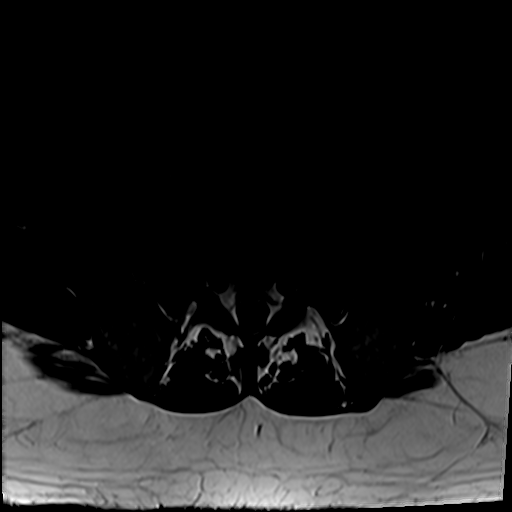
[im 6/38]
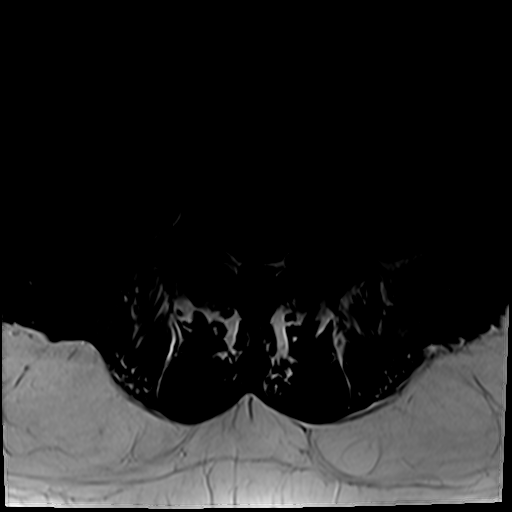
[im 12/38]
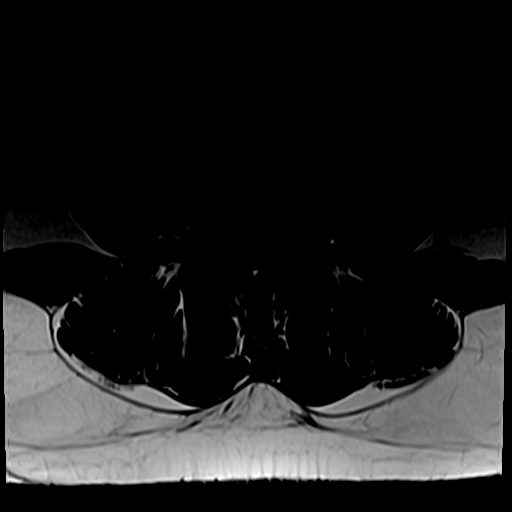
[im 18/38]
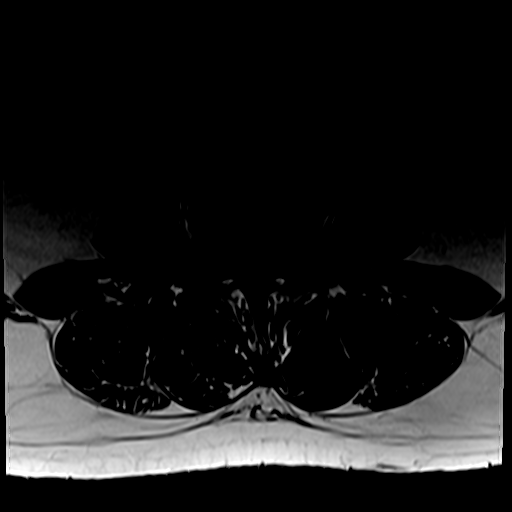
[im 20/38]
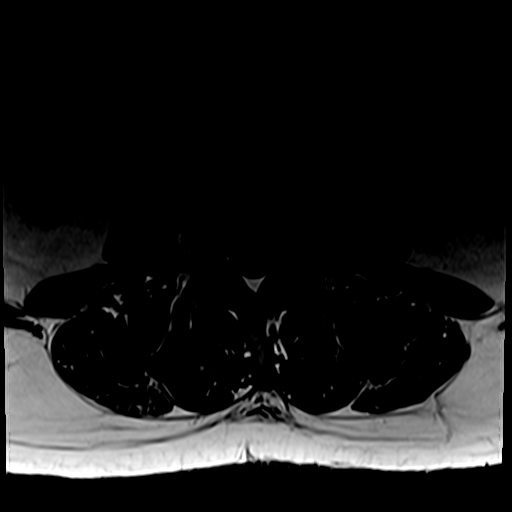
[im 26/38]
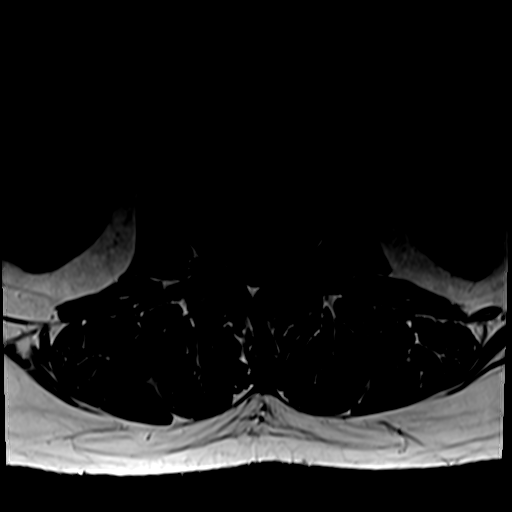
[im 32/38]
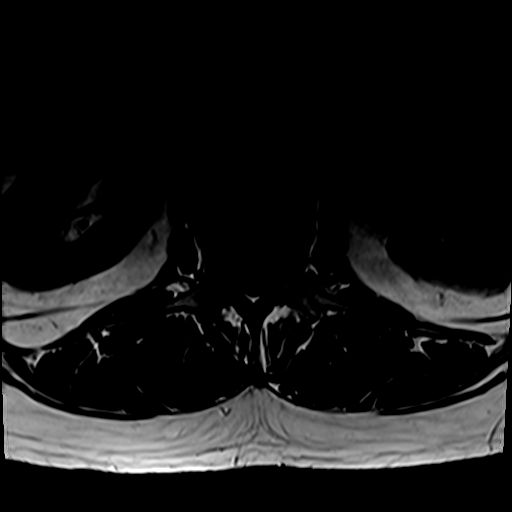
[im 38/38]
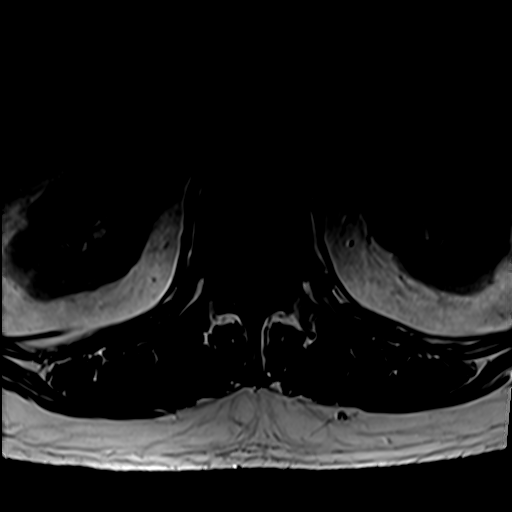

[31 of 48 positions shown; findings below may reference images not displayed]

FINDINGS: Segmentation:  5 non rib-bearing lumbar vertebral bodies.

Alignment:  No substantial sagittal subluxation.

Vertebrae: Vertebral body heights are maintained. No focal marrow
edema to suggest acute fracture or discitis/osteomyelitis. No
suspicious bone lesions. Scattered degenerative Schmorl's nodes.

Conus medullaris and cauda equina: Conus extends to the L1 level.
Conus and cauda equina appear normal.

Paraspinal and other soft tissues: Bilateral renal cysts.

Disc levels:

T12-L1: No significant disc protrusion, foraminal stenosis, or canal
stenosis.

L1-L2: No significant disc protrusion, foraminal stenosis, or canal
stenosis.

L2-L3: Mild disc height loss and desiccation. Mild disc bulging
without significant canal or foraminal stenosis.

L3-L4: Mild disc height loss and desiccation. Mild disc bulging.
Mild right foraminal stenosis. No significant canal or left
foraminal stenosis.

L4-L5: Mild facet arthropathy. Mild bilateral foraminal stenosis
without significant canal stenosis.

L5-S1: Mild bilateral facet arthropathy with mild bilateral
foraminal stenosis. No significant canal stenosis.
IMPRESSION: Mild multilevel degenerative change with mild foraminal stenosis on
the right at L3-L4 and bilaterally at L4-L5 and L5-S1. No
significant canal stenosis.
